# Patient Record
Sex: Female | Born: 1965
Health system: Southern US, Community
[De-identification: ages and names within clinical notes are randomized; demographics above are authoritative.]

## PROBLEM LIST (undated history)

## (undated) DIAGNOSIS — Z9889 Other specified postprocedural states: Secondary | ICD-10-CM

## (undated) DIAGNOSIS — R112 Nausea with vomiting, unspecified: Secondary | ICD-10-CM

---

## 1997-08-04 ENCOUNTER — Other Ambulatory Visit: Admission: RE | Admit: 1997-08-04 | Discharge: 1997-08-04 | Payer: Self-pay | Admitting: Obstetrics and Gynecology

## 1998-10-14 ENCOUNTER — Other Ambulatory Visit: Admission: RE | Admit: 1998-10-14 | Discharge: 1998-10-14 | Payer: Self-pay | Admitting: Obstetrics and Gynecology

## 1999-07-19 ENCOUNTER — Inpatient Hospital Stay (HOSPITAL_COMMUNITY): Admission: AD | Admit: 1999-07-19 | Discharge: 1999-07-22 | Payer: Self-pay | Admitting: Obstetrics and Gynecology

## 1999-07-23 ENCOUNTER — Encounter: Admission: RE | Admit: 1999-07-23 | Discharge: 1999-09-02 | Payer: Self-pay | Admitting: Obstetrics and Gynecology

## 1999-08-29 ENCOUNTER — Other Ambulatory Visit: Admission: RE | Admit: 1999-08-29 | Discharge: 1999-08-29 | Payer: Self-pay | Admitting: Obstetrics and Gynecology

## 2000-10-30 ENCOUNTER — Other Ambulatory Visit: Admission: RE | Admit: 2000-10-30 | Discharge: 2000-10-30 | Payer: Self-pay | Admitting: Obstetrics and Gynecology

## 2001-11-25 ENCOUNTER — Other Ambulatory Visit: Admission: RE | Admit: 2001-11-25 | Discharge: 2001-11-25 | Payer: Self-pay | Admitting: Obstetrics and Gynecology

## 2001-12-24 ENCOUNTER — Encounter: Admission: RE | Admit: 2001-12-24 | Discharge: 2001-12-24 | Payer: Self-pay | Admitting: Obstetrics and Gynecology

## 2001-12-24 ENCOUNTER — Encounter: Payer: Self-pay | Admitting: Obstetrics and Gynecology

## 2002-03-20 ENCOUNTER — Other Ambulatory Visit: Admission: RE | Admit: 2002-03-20 | Discharge: 2002-03-20 | Payer: Self-pay | Admitting: Obstetrics and Gynecology

## 2002-12-02 ENCOUNTER — Other Ambulatory Visit: Admission: RE | Admit: 2002-12-02 | Discharge: 2002-12-02 | Payer: Self-pay | Admitting: Obstetrics and Gynecology

## 2003-09-03 ENCOUNTER — Other Ambulatory Visit: Admission: RE | Admit: 2003-09-03 | Discharge: 2003-09-03 | Payer: Self-pay | Admitting: Obstetrics and Gynecology

## 2004-01-31 HISTORY — PX: ENDOMETRIAL ABLATION: SHX621

## 2004-03-17 ENCOUNTER — Encounter (INDEPENDENT_AMBULATORY_CARE_PROVIDER_SITE_OTHER): Payer: Self-pay | Admitting: Specialist

## 2004-03-17 ENCOUNTER — Inpatient Hospital Stay (HOSPITAL_COMMUNITY): Admission: RE | Admit: 2004-03-17 | Discharge: 2004-03-19 | Payer: Self-pay | Admitting: Obstetrics and Gynecology

## 2004-03-20 ENCOUNTER — Encounter: Admission: RE | Admit: 2004-03-20 | Discharge: 2004-04-14 | Payer: Self-pay | Admitting: Obstetrics and Gynecology

## 2004-05-10 ENCOUNTER — Other Ambulatory Visit: Admission: RE | Admit: 2004-05-10 | Discharge: 2004-05-10 | Payer: Self-pay | Admitting: Obstetrics and Gynecology

## 2005-04-12 ENCOUNTER — Ambulatory Visit (HOSPITAL_COMMUNITY): Admission: RE | Admit: 2005-04-12 | Discharge: 2005-04-12 | Payer: Self-pay | Admitting: Obstetrics and Gynecology

## 2005-04-12 ENCOUNTER — Encounter (INDEPENDENT_AMBULATORY_CARE_PROVIDER_SITE_OTHER): Payer: Self-pay | Admitting: *Deleted

## 2005-10-05 ENCOUNTER — Ambulatory Visit (HOSPITAL_COMMUNITY): Admission: RE | Admit: 2005-10-05 | Discharge: 2005-10-05 | Payer: Self-pay | Admitting: Specialist

## 2005-10-05 ENCOUNTER — Encounter: Payer: Self-pay | Admitting: Vascular Surgery

## 2008-06-24 ENCOUNTER — Encounter: Admission: RE | Admit: 2008-06-24 | Discharge: 2008-06-24 | Payer: Self-pay | Admitting: Family Medicine

## 2008-08-04 ENCOUNTER — Other Ambulatory Visit: Admission: RE | Admit: 2008-08-04 | Discharge: 2008-08-04 | Payer: Self-pay | Admitting: Obstetrics and Gynecology

## 2009-02-19 ENCOUNTER — Encounter: Admission: RE | Admit: 2009-02-19 | Discharge: 2009-02-19 | Payer: Self-pay | Admitting: Obstetrics and Gynecology

## 2009-03-15 ENCOUNTER — Other Ambulatory Visit: Admission: RE | Admit: 2009-03-15 | Discharge: 2009-03-15 | Payer: Self-pay | Admitting: Obstetrics and Gynecology

## 2009-09-29 ENCOUNTER — Other Ambulatory Visit: Admission: RE | Admit: 2009-09-29 | Discharge: 2009-09-29 | Payer: Self-pay | Admitting: Obstetrics and Gynecology

## 2010-06-17 NOTE — Discharge Summary (Signed)
[facility]  Patient:    Anna Wade, Anna Wade                      MRN: [id number] Adm. Date:  [id number] Disc. Date: [id number] Attending:  Trevor Iha Dictator:   Danie Chandler, R.N.                           Discharge Summary  ADMISSION DIAGNOSES:          1. Intrauterine pregnancy at 36-4/7 weeks.                               2. Breech presentation.                               3. Rupture of membranes in early labor.  DISCHARGE DIAGNOSES:          1. Intrauterine pregnancy at 36-4/7 weeks.                               2. Breech presentation.                               3. Rupture of membranes in early labor.  PROCEDURE:                    On July 19, 1999, primary low transverse cesarean  section.  REASON FOR ADMISSION:         The patient is a 45 year old married white female, gravida 2, para 0, with an estimated date of confinement of August 12, 1999, placing her at 36-4/[redacted] weeks gestation.  The patient complained of spontaneous rupture of membranes for gross clear fluid at approximately 5:30 a.m. on July 19, 1999. Evaluation in triage revealed gross rupture of membranes and the cervix to be closed and 50%.  An ultrasound was consistent with breech presentation.  Fetal heart tones were nonreactive and the patient was having mild contractions. Recommendation for cesarean section was made.  HOSPITAL COURSE:              The patient was taken to the operating room and underwent the above named procedure without complications.  This was productive of a viable female infant with Apgars of 8 at one minute and 9 at five minutes. Postoperatively, the patient did well.  On postoperative day #1, the patients hemoglobin was 11.1, hematocrit 32.3, and white blood cell count 9.9.  The patient had a good return of bowel function on this day and she was tolerating a regular diet.  She also had good pain control.  On postoperative day #2, the  patient was ambulating well without difficulty and she was discharged home on postoperative day #3.  CONDITION ON DISCHARGE:       Good.  DIET:                         Regular as tolerated.  ACTIVITY:                     No heavy lifting, no driving, and no vaginal entry.  FOLLOW-UP:  She is to follow up in the office in one to two weeks for incision check and she is to call for a temperature greater than 100 degrees, persistent nausea and vomiting, heavy vaginal bleeding, and/or redness or drainage from the incision site.  DISCHARGE MEDICATIONS:        1. Prenatal vitamins one p.o. q.d.                               2. Tylox one to two p.o. q.4h. p.r.n. pain.                               3. Motrin 600 mg one p.o. q.6h. p.r.n. pain. DD:  08/11/99 TD:  08/11/99 Job: [id number]

## 2010-06-17 NOTE — Op Note (Signed)
Anna, Wade NO.:  [id number]   MEDICAL RECORD NO.:  [id number]          PATIENT TYPE:  INP   LOCATION:  9103                          [facility]ILITY[facility]   PHYSICIAN:  Dineen Kid. Rana Snare, M.D.    DATE OF BIRTH:  1965-07-02   DATE OF PROCEDURE:  03/17/2004  DATE OF DISCHARGE:                                 OPERATIVE REPORT   PREOPERATIVE DIAGNOSES:  1.  Intrauterine pregnancy at 39 weeks.  2.  Previous cesarean section with desired repeat.  3.  Multiparity but desires sterility.   POSTOPERATIVE DIAGNOSES:  1.  Intrauterine pregnancy at 39 weeks.  2.  Previous cesarean section with desired repeat.  3.  Multiparity but desires sterility.   PROCEDURES:  1.  Repeat low segment transverse cesarean section.  2.  [place]land bilateral tubal ligation.   SURGEON:  Dineen Kid. Rana Snare, M.D.   ANESTHESIA:  Spinal.   INDICATIONS:  Anna Wade is a 45 year old G3, P1, A1, at 4 weeks'  gestation, who presents for repeat cesarean section.  The first pregnancy  was complicated by breech presentation requiring a cesarean section.  She  also desires sterilization and plans tubal ligation.  Risks and benefits  were discussed, informed consent was obtained.   Findings at the time of surgery were a viable female infant, Apgars were 8 and  9, pH arterial 7.31, weight is 8 pounds 8 ounces.   DESCRIPTION OF PROCEDURE:  After adequate analgesia, the patient was placed  in the supine position with a left lateral tilt.  She was sterilely prepped  and draped, the bladder was sterilely drained with a Foley catheter.  A  Pfannenstiel skin incision was made two fingerbreadths above the pubic  symphysis, taken sharply the previous Pfannenstiel skin incision from the  skin.  This was taken down sharply to the fascia, which was incised  transversely and extended superiorly and inferiorly off the bellies of the  rectus muscle, which were separated sharply in the midline.  The peritoneum  was  entered sharply.  The bladder flap was noted to be very low, and there  was a thin window at the lower segment.  The myotomy incision was then made  through that lower segment with clear fluid noted.  This was extended  laterally with the operator's fingertips.  A vacuum extractor was used to  direct the head through the myotomy incision, and the nares and pharynx were  suctioned, the placenta delivered, the cord clamped and cut, handed to  pediatricians for resuscitation.  The cord blood was obtained, placenta  extracted manually.  The uterus was exteriorized, wiped clean with a dry  lap.  The myotomy incision was closed in two layers, the first being a  running locking layer, the second being an imbricating layer of 0 Monocryl  suture.  The right fallopian tube was identified by the fimbriated end, a  portion of the tube grasped.  Two sutures of 0 plain stitch were passed in  the mesosalpinx, doubly ligated, the central portion excised.  Tubal ostia  were made hemostatic with Bovie cautery.  The proximal portion of the tube  was ligated with 2-0 silk.  The left fallopian tube was identified by the  fimbriated end, the midportion of the tube grasped with a Babcock clamp, two  sutures of 0 plain passed through the mesosalpinx, doubly ligated, the  central portion excised.  The tubal ostia were made hemostatic with Bovie  cautery.  The proximal portion of the tube ligated with 2-0 silk.  The  uterus was placed back in the peritoneal cavity and after a copious amount  of irrigation, the fallopian tubes were checked as well as the myotomy  incision and noted to be hemostatic.  The peritoneum was then closed with 0  Monocryl suture, the rectus muscle plicated in the midline, irrigation  applied, and after adequate hemostasis the fascia was closed with #1 PDS in  running fashion.  Irrigation of skin applied and after adequate hemostasis,  the skin was stapled and Steri-Strips applied.  The  patient tolerated the  procedure well and was stable on transfer to the recovery room.  The sponge,  needle and instrument count was normal x3.  Estimated blood loss was 600 mL.  The patient received 1 g of Rocephin after delivery of the placenta.      DCL/MEDQ  D:  03/17/2004  T:  03/17/2004  Job:  [id number]

## 2010-06-17 NOTE — Op Note (Signed)
[facility]  Patient:    Anna Wade, Anna Wade                      MRN: [id number] Proc. Date: 07/19/99 Adm. Date:  [id number] Attending:  Trevor Iha                           Operative Report  PREOPERATIVE DIAGNOSIS:       Intrauterine pregnancy at 36-4/7 weeks.  Breech presentation.  Rupture of membranes in early labor.  POSTOPERATIVE DIAGNOSIS:      Intrauterine pregnancy at 36-4/7 weeks.  Breech presentation.  Rupture of membranes in early labor.  OPERATION:                    Low transverse cesarean section.  SURGEON:                      Guy Sandifer. Arleta Creek, M.D.  ASSISTANT:  ANESTHESIA:                   Spinal by J. Leilani Able, Montez Hageman., M.D.  ESTIMATED BLOOD LOSS:         600 cc.  FINDINGS:                     Viable female infant with Apgars of 8 and 9 at one nd five minutes, respectively.  Birth weight and arterial cord pH pending.  INDICATIONS:                  The patient is a 45 year old married white female, gravida 2, para 0, abortus 1, EDC of August 12, 1999, consistent with early ultrasound placing her at 36-4/7 weeks.  She complains of spontaneous rupture of membranes for gross clear fluid at 5:30 a.m. today.  Evaluation in triage reveals gross rupture of membranes, the cervix to be closed and 50%, and ultrasound consistent with breech.  Fetal heart tones are reactive and the patient is having mild contractions.  Recommendation for cesarean section is made.  Possible risks and complications are discussed with the patient and her husband including, but not limited to, infection, bowel, bladder, or ureteral damage, bleeding requiring transfusion of blood products with possible transfusion reactive, HIV, and hepatitis acquisition, DVT, PE, and pneumonia.  All questions are answered and consent is signed and on the chart.  DESCRIPTION OF PROCEDURE:     The patient is taken to the operating room where  spinal  anesthetic is placed.  She is then placed in the dorsal supine position ith a 15 degree left lateral wedge.  She is then prepped, Foley catheter is placed n the bladder as a drain, and she is draped in a sterile fashion.  After testing or adequate spinal anesthesia, the skin is entered through a Pfannenstiel incision and dissection is carried out in layers to the peritoneum.  The peritoneum is sharply entered and extended superiorly and inferiorly.  The vesicouterine peritoneum is taken down cephalolaterally, the bladder flap is developed and the bladder blade is placed.  The uterus is incised in a low transverse manner.  The uterine cavity s entered bluntly with a Kelly clamp.  Incision is extended cephalolaterally with the fingers.  The infant is then delivered from the breech presentation without difficulty.  Oral and nasopharynx are suctioned.  Good cry and tone is noted. he cord is clamped and cut  and the infant is handed to the awaiting pediatrics team. The placenta is manually delivered.  Internal uterine contour is normal.  The uterus is closed in a running locking fashion with 0 Monocryl suture and good hemostasis is obtained.  The tubes and ovaries are normal.  Anterior peritoneum is closed in a running fashion with 0 Monocryl which is also used to reapproximate the pyramidalis muscle in the midline.  The anterior rectus fascia is closed in a running fashion with 0 PDS and the skin is closed with clips.  All sponge, needle, and instrument counts were correct.  The patient is transferred to the recovery  room in stable condition. DD:  07/19/99 TD:  07/20/99 Job: [id number]

## 2010-06-17 NOTE — Discharge Summary (Signed)
NAMESHARMEL, BALLANTINE NO.:  [id number]   MEDICAL RECORD NO.:  [id number]          PATIENT TYPE:  INP   LOCATION:  9103                          [facility]   PHYSICIAN:  Dineen Kid. Rana Snare, M.D.    DATE OF BIRTH:  10/27/1965   DATE OF ADMISSION:  03/17/2004  DATE OF DISCHARGE:  03/19/2004                                 DISCHARGE SUMMARY   ADMISSION DIAGNOSES:  1.  Intrauterine pregnancy at 56 weeks' estimated gestational age.  2.  Previous cesarean section, desires repeat.  3.  Multiparity, desires sterility.   DISCHARGE DIAGNOSES:  1.  Status post low transverse cesarean section.  2.  Viable female infant.  3.  Permanent sterilization.   PROCEDURES:  1.  Repeat low transverse cesarean section.  2.  [place] bilateral tubal ligation.   REASON FOR ADMISSION:  Please see written H&P.   HOSPITAL COURSE:  The patient is a 45 year old. G3, P1 that was admitted to  [facility] at 39 weeks' estimated gestational age for a  scheduled cesarean delivery.  Her first pregnancy had been complicated by a  breech presentation requiring cesarean delivery.  Due to multiparity, the  patient also had requested permanent sterilization.  On the morning of  admission, the patient was taken to the operating room where spinal  anesthesia was administered without difficulty.  A low transverse incision  was made with delivery of a viable female infant weighing 8 pounds 8 ounces  with Apgar's 8 and 9 at 1 and 5 minutes.  Arterial cord pH of 7.31.  The  patient also had bilateral tubal ligation without difficulty.  She tolerated  the procedure well and was taken to the recovery room in stable condition.  On postop day #1, the patient was without complaint.  Vital signs were  stable.  She was afebrile.  Abdomen was soft.  Fundus was firm and  nontender.  Abdominal dressing was noted to have a small amount of drainage  noted on bandage.  Foley had been discontinued.  The  patient was voiding  adequately.  She was ambulating well.  Laboratory data revealed hemoglobin  of 11.7, platelet count 152,000, WBC of 8.9.  IV was discontinued.  Abdominal dressing was removed.  On the following morning, the patient was  without complaint.  She desired early discharge.  Vital signs were stable.  She was afebrile.  Fundus was firm and nontender.  Incision was clean, dry  and intact.  Staples were removed and the patient was discharged home.   CONDITION ON DISCHARGE:  Good.   DIET:  Regular as tolerated.   ACTIVITY:  No heavy lifting, no driving x2 weeks, no vaginal entry.   FOLLOW UP:  The patient is to follow up in the office in 1-2 weeks for  incision check.  She is to call for temperature greater than 100 degrees,  persistent nausea and vomiting, heavy vaginal bleeding and/or redness or  drainage from incisional site.   DISCHARGE MEDICATIONS:  1.  Tylox, #30, one p.o. every 4-6 hours p.r.n.  2.  Motrin 600 mg  every 6 hours.  3.  Prenatal vitamins one p.o. daily.  4.  Colace one p.o. daily p.r.n.      CC/MEDQ  D:  04/04/2004  T:  04/04/2004  Job:  [id number]

## 2010-06-17 NOTE — Op Note (Signed)
Anna Wade, Anna Wade NO.:  [id number]   MEDICAL RECORD NO.:  [id number]          PATIENT TYPE:  AMB   LOCATION:  SDC                           [facility]   PHYSICIAN:  Dineen Kid. Rana Snare, M.D.    DATE OF BIRTH:  12-08-1965   DATE OF PROCEDURE:  04/12/2005  DATE OF DISCHARGE:                                 OPERATIVE REPORT   PREOPERATIVE DIAGNOSES:  1.  Menometrorrhagia.  2.  History of menorrhagia.  3.  Endometrial mass on salin infusion ultrasound.   POSTOPERATIVE DIAGNOSES:  1.  Menometrorrhagia.  2.  History of menorrhagia.  3.  Endometrial mass seen on saline infusion ultrasound.   OPERATION/PROCEDURE:  1.  Hysteroscopy with dilatation and curettage.  2.  Polypectomy.  3.  NovaSure endometrial ablation.   SURGEON:  Dineen Kid. Rana Snare, M.D.   ANESTHESIA:  Monitored anesthesia care and local paracervical block.   INDICATIONS:  Mrs. Anna Wade is a 45 year old who has had a tubal ligation in  the past.  She has continued on birth control pills due to menorrhagia with  only some bleeding.  She began having menometrorrhagia.  Underwent a saline  infusion ultrasound showing endometrial polyp. She presents for hysteroscopy  and seen for evaluation of removal of the polyp.  Because of persistent  menorrhagia and no future fertility, having had a tubal ligation, she  desires NovaSure endometrial ablation.  The risks and benefits of both  procedures were discussed at length which include but not limited to risk of  infection, bleeding, damage to uterus, tubes, ovaries, bowel, bladder,  possibility of recurrence if endometrial polyp, the possibility that this  may not alleviate the bleeding,  risks associated with anesthesia, risks  associated with blood loss requiring transfusion.  She gives her informed  consent and wished to proceed.   OPERATIVE FINDINGS:  Normal endometrial cavity.  Normal-appearing ostia.  She does have a posterior endometrial wall thickening  consistent with a  small endometrial polyp.   DESCRIPTION OF PROCEDURE:  After adequate anesthesia, the patient was placed  in the dorsal lithotomy position. She was sterilely prepped and draped.  The  bladder was sterilely drained.  Speculum was placed.  Tenaculum was placed  on the anterior lip of the cervix.  Paracervical block was placed with 1%  Xylocaine with 1:100,000 epinephrine, a total of 20 mL used.  Uterus was  sounded to 8.5 cm, easily dilated to a #27 Pratt dilator.  The cervix was  sounded to 3.5 cm.  Hysteroscope was inserted.  The above findings were  noted.  Curettage was performed retrieving small fragments of endometrial  polyp.  This was followed by NovaSure endometrial ablation  the cavity width  at 8.7, total cavity length of 5.  After test with the NovaSure device for  proper seal, the procedure was carried out with a total power of 102 for  total time of 75 seconds.  Procedure was apparently uncomplicated followed  by hysteroscopy revealing a good  thermal burn throughout the entirety of  the cavity.  No obvious perforations were noted.  The  hysteroscope was  removed.  The tenaculum removed from the anterior lip of the cervix, noted  to be hemostatic.  The patient was then transferred to the recovery room in  stable condition.  Sorbitol deficit was 60 mL.  The patient received 30 mg  Toradol postoperatively.   The patient received 1 g of Rocephin.   DISPOSITION:  The patient was discharged home for followup in the office in  two to three weeks.  Sent home with routine instruction sheet for D&C and  told to return for increased pain, fever or bleeding.      Dineen Kid Rana Snare, M.D.  Electronically Signed     DCL/MEDQ  D:  04/12/2005  T:  04/13/2005  Job:  [id number]

## 2010-06-17 NOTE — H&P (Signed)
NAMEHECTOR, VENNE NO.:  [id number]   MEDICAL RECORD NO.:  [id number]          PATIENT TYPE:  INP   LOCATION:  NA                            [facility]   PHYSICIAN:  Dineen Kid. Rana Snare, M.D.    DATE OF BIRTH:  August 23, 1965   DATE OF ADMISSION:  03/17/2004  DATE OF DISCHARGE:                                HISTORY & PHYSICAL   HISTORY OF PRESENT ILLNESS:  Ms. Wilmouth is a 45 year old G3, P1, A1, at 62  weeks' gestational age who repeats for repeat cesarean section.  Her first  pregnancy was complicated by breech presentation requiring cesarean section  and she desires repeat C-section .  This pregnancy has been uncomplicated.  She did have amniocentesis for advanced maternal age which returned as  normal.  She also has multiparity and desires sterility.  Her EDC is  March 21, 2004, and she is Group B strep positive.   PAST MEDICAL HISTORY:  Negative.   PAST SURGICAL HISTORY:  C-section in June 2001.   MEDICATIONS:  Prenatal vitamins.   She has no known drug allergies.   PHYSICAL EXAMINATION:  VITAL SIGNS:  Her blood pressure is 118/62, weight  224.  HEART:  Regular rate and rhythm.  LUNGS:  Clear to auscultation bilaterally.  ABDOMEN:  Gravid, nontender.  PELVIC:  Cervix is closed, thick, and high.   IMPRESSION/PLAN:  1.  Intrauterine pregnancy at 39 weeks with previous cesarean section,      desires repeat.  Plan:  Repeat low transverse cesarean section.  2.  Multiparity with desired sterility.  Plan:  Parkland bilateral tubal      ligation.  The risks and benefits were discussed at length which include      5 out of 1000 failure.  She gives informed consent and wishes to      proceed.      DCL/MEDQ  D:  03/16/2004  T:  03/16/2004  Job:  [id number]

## 2010-09-29 ENCOUNTER — Other Ambulatory Visit: Payer: Self-pay | Admitting: Family Medicine

## 2010-09-29 ENCOUNTER — Other Ambulatory Visit: Payer: Self-pay | Admitting: Nurse Practitioner

## 2010-09-29 ENCOUNTER — Other Ambulatory Visit (HOSPITAL_COMMUNITY)
Admission: RE | Admit: 2010-09-29 | Discharge: 2010-09-29 | Disposition: A | Discharge status: Home / Self Care | Payer: 59 | Source: Ambulatory Visit | Attending: Obstetrics and Gynecology | Admitting: Obstetrics and Gynecology

## 2010-09-29 DIAGNOSIS — Z01419 Encounter for gynecological examination (general) (routine) without abnormal findings: Secondary | ICD-10-CM | POA: Insufficient documentation

## 2010-09-29 DIAGNOSIS — Z1231 Encounter for screening mammogram for malignant neoplasm of breast: Secondary | ICD-10-CM

## 2010-10-06 ENCOUNTER — Ambulatory Visit
Admission: RE | Admit: 2010-10-06 | Discharge: 2010-10-06 | Disposition: A | Discharge status: Home / Self Care | Payer: 59 | Source: Ambulatory Visit | Attending: Family Medicine | Admitting: Family Medicine

## 2010-10-06 DIAGNOSIS — Z1231 Encounter for screening mammogram for malignant neoplasm of breast: Secondary | ICD-10-CM

## 2011-05-16 ENCOUNTER — Other Ambulatory Visit: Payer: Self-pay | Admitting: Family Medicine

## 2011-05-16 DIAGNOSIS — R221 Localized swelling, mass and lump, neck: Secondary | ICD-10-CM

## 2011-05-17 ENCOUNTER — Ambulatory Visit
Admission: RE | Admit: 2011-05-17 | Discharge: 2011-05-17 | Disposition: A | Discharge status: Home / Self Care | Payer: 59 | Source: Ambulatory Visit | Attending: Family Medicine | Admitting: Family Medicine

## 2011-05-17 DIAGNOSIS — R221 Localized swelling, mass and lump, neck: Secondary | ICD-10-CM

## 2013-09-25 ENCOUNTER — Other Ambulatory Visit: Payer: Self-pay | Admitting: Family Medicine

## 2013-09-25 DIAGNOSIS — R591 Generalized enlarged lymph nodes: Secondary | ICD-10-CM

## 2013-10-30 ENCOUNTER — Encounter (INDEPENDENT_AMBULATORY_CARE_PROVIDER_SITE_OTHER): Payer: Self-pay

## 2013-10-30 ENCOUNTER — Ambulatory Visit
Admission: RE | Admit: 2013-10-30 | Discharge: 2013-10-30 | Disposition: A | Discharge status: Home / Self Care | Payer: 59 | Source: Ambulatory Visit | Attending: Family Medicine | Admitting: Family Medicine

## 2013-10-30 DIAGNOSIS — R591 Generalized enlarged lymph nodes: Secondary | ICD-10-CM

## 2014-03-04 ENCOUNTER — Other Ambulatory Visit: Payer: Self-pay | Admitting: Obstetrics and Gynecology

## 2014-03-06 LAB — CYTOLOGY - PAP

## 2014-06-08 ENCOUNTER — Other Ambulatory Visit: Payer: Self-pay | Admitting: Family Medicine

## 2014-06-08 DIAGNOSIS — R51 Headache: Principal | ICD-10-CM

## 2014-06-08 DIAGNOSIS — R519 Headache, unspecified: Secondary | ICD-10-CM

## 2014-06-10 ENCOUNTER — Ambulatory Visit
Admission: RE | Admit: 2014-06-10 | Discharge: 2014-06-10 | Disposition: A | Discharge status: Home / Self Care | Payer: 59 | Source: Ambulatory Visit | Attending: Family Medicine | Admitting: Family Medicine

## 2014-06-10 DIAGNOSIS — R519 Headache, unspecified: Secondary | ICD-10-CM

## 2014-06-10 DIAGNOSIS — R51 Headache: Principal | ICD-10-CM

## 2014-06-10 MED ORDER — GADOBENATE DIMEGLUMINE 529 MG/ML IV SOLN: 16.0000 mL | Freq: Once | INTRAVENOUS | Status: AC | PRN

## 2014-11-03 ENCOUNTER — Encounter: Payer: Self-pay | Admitting: Allergy and Immunology

## 2014-11-03 ENCOUNTER — Ambulatory Visit (INDEPENDENT_AMBULATORY_CARE_PROVIDER_SITE_OTHER): Payer: 59 | Admitting: Allergy and Immunology

## 2014-11-03 VITALS — BP 98/62 | HR 62 | Temp 97.8°F | Resp 14 | Ht 64.96 in | Wt 179.7 lb

## 2014-11-03 DIAGNOSIS — L719 Rosacea, unspecified: Secondary | ICD-10-CM

## 2014-11-03 DIAGNOSIS — H1045 Other chronic allergic conjunctivitis: Secondary | ICD-10-CM | POA: Diagnosis not present

## 2014-11-03 DIAGNOSIS — H10829 Rosacea conjunctivitis, unspecified eye: Secondary | ICD-10-CM

## 2014-11-03 DIAGNOSIS — H1010 Acute atopic conjunctivitis, unspecified eye: Secondary | ICD-10-CM

## 2014-11-03 MED ORDER — METHYLPREDNISOLONE ACETATE 80 MG/ML IJ SUSP
80.0000 mg | Freq: Once | INTRAMUSCULAR | Status: AC
  Administered 2014-11-03: 80 mg via INTRAMUSCULAR

## 2014-11-03 MED ORDER — MONTELUKAST SODIUM 10 MG PO TABS: 10.0000 mg | ORAL_TABLET | Freq: Every day | ORAL | Status: DC

## 2014-11-03 MED ORDER — DOXYCYCLINE HYCLATE 50 MG PO CAPS: 100.0000 mg | ORAL_CAPSULE | Freq: Every day | ORAL | Status: DC

## 2014-11-03 NOTE — Progress Notes (Signed)
Anna Wade    NEW PATIENT NOTE  Subjective:   Patient ID: Anna Wade is a 49 y.o. female with a chief complaint of Allergies  and the following problems:  HPI Comments: . Anna Wade has a one year history of eye problems presenting as burning / itching, watery eyes, a tired feeling to her eyes, without any associated nasal symptoms or obvious triggers. She has seen three eye doctors who have informed her that she does not have dry eye or other significant problems and they have treated her with various eye drops including pataday / pazeo/ lotemax, and antihistamines, all of which really did not help her. She has no other associated atopic diseases or symptoms. She has not noted any facial blushing or flushing or skin itchiness.   History reviewed. No pertinent past medical history.  Past Surgical History  Procedure Laterality Date  . Cesarean section      x2     Current outpatient prescriptions:  .  amphetamine-dextroamphetamine (ADDERALL) 20 MG tablet, Take 20 mg by mouth 2 (two) times daily., Disp: , Rfl:  .  Multiple Vitamin (MULTIVITAMIN) tablet, Take 1 tablet by mouth daily., Disp: , Rfl:  .  Omega-3 Fatty Acids (FISH OIL) 1000 MG CAPS, Take by mouth 2 (two) times daily., Disp: , Rfl:  .  LOTEMAX 0.5 % GEL, Place 1 drop into both eyes as needed., Disp: , Rfl: 0  No orders of the defined types were placed in this encounter.    No Known Allergies  Review of Systems  Constitutional: Negative.   HENT: Negative for congestion, hearing loss, nosebleeds, sore throat and tinnitus.   Eyes: Positive for discharge and redness. Negative for blurred vision, double vision, photophobia and pain.  Respiratory: Negative for cough, sputum production, shortness of breath and wheezing.   Cardiovascular: Negative for chest pain and leg swelling.  Gastrointestinal: Negative for heartburn and nausea.  Skin: Negative for itching and  rash.  Neurological: Negative for dizziness and headaches.    History reviewed. No pertinent family history.  Social History   Social History  . Marital Status: Single    Spouse Name: N/A  . Number of Children: N/A  . Years of Education: N/A   Occupational History  . Not on file.   Social History Main Topics  . Smoking status: Former Smoker    Quit date: 04/29/2004  . Smokeless tobacco: Not on file  . Alcohol Use: Not on file     Comment: 1x a month beer  . Drug Use: No  . Sexual Activity: Not on file   Other Topics Concern  . Not on file   Social History Narrative  . No narrative on file    Environmental History: Pets in the home: dogs (1) and cats (1). Flooring: wall-to-wall carpeting Climate Control: central or room air conditioning Dust Mite Controls: Dust mite controls are not in place. Tobacco Smoke in Home: no    Objective:   Filed Vitals:   11/03/14 0909  BP: 98/62  Pulse: 62  Temp: 97.8 F (36.6 C)  Resp: 14    Physical Exam  Constitutional: She is oriented to person, place, and time and well-developed, well-nourished, and in no distress.  HENT:  Right Ear: Tympanic membrane and external ear normal.  Left Ear: Tympanic membrane and external ear normal.  Nose: Nose normal. No mucosal edema, rhinorrhea, nose lacerations, sinus tenderness, nasal deformity or septal deviation.  Mouth/Throat:  Oropharynx is clear and moist.  Eyes: Pupils are equal, round, and reactive to light. Right eye exhibits no chemosis, no discharge and no exudate. No foreign body present in the right eye. Left eye exhibits no chemosis, no discharge and no exudate. No foreign body present in the left eye. Right conjunctiva is injected. Left conjunctiva is injected. Left conjunctiva has no hemorrhage. No scleral icterus.  Neck: No JVD present. No tracheal deviation present. No thyromegaly present.  Cardiovascular: Normal rate, regular rhythm and normal heart sounds.  Exam reveals no  gallop.   No murmur heard. Pulmonary/Chest: No stridor. No respiratory distress. She has no wheezes. She has no rales. She exhibits no tenderness.  Abdominal: She exhibits no distension and no mass. There is no tenderness. There is no rebound and no guarding.  Musculoskeletal: She exhibits no edema.  Lymphadenopathy:    She has no cervical adenopathy.  Neurological: She is alert and oriented to person, place, and time.  Skin: No rash noted. No erythema. No pallor.    Diagnostics: allergy skin tests were performed. She demonstrated positives to Mold..     Assessment and Plan:   1. Seasonal allergic conjunctivitis   2. Rosacea blepharoconjunctivitis         1. Allergen avoidance measures  2. Treat inflammation:   A. Depomerdol  IM now  B. Montelukast  one tablet one time per day  3.Treat rosacea:   A. Doxycyline  - one tablet one time per day.  4. Can continue OTC antihistamine and prescription eye drops  5. Return in 4 weeks  Hopefully with a combination of allergen avoidance, anti-inflammatory agents, and therapy for rosacea we can finally get Zelpha' eye inflammation under better control. She has been stuck in this inflammatory state for almost a year. i will regroup with her in 4 weeks to assess her response.   Anna Schimke, MD [facility]

## 2014-11-03 NOTE — Patient Instructions (Signed)
  1. Allergen avoidance measures  2. Treat inflammation:   A. Depomerdol  IM now  B. Montelukast  one tablet one time per day  3.Treat rosacea:   A. Doxycyline  - one tablet one time per day.  4. Can continue OTC antihistamine and prescription eye drops  5. Return in 4 weeks

## 2016-02-28 DIAGNOSIS — H04123 Dry eye syndrome of bilateral lacrimal glands: Secondary | ICD-10-CM | POA: Diagnosis not present

## 2016-04-26 DIAGNOSIS — Z0000 Encounter for general adult medical examination without abnormal findings: Secondary | ICD-10-CM | POA: Diagnosis not present

## 2016-04-26 DIAGNOSIS — Z13220 Encounter for screening for lipoid disorders: Secondary | ICD-10-CM | POA: Diagnosis not present

## 2016-04-26 DIAGNOSIS — E538 Deficiency of other specified B group vitamins: Secondary | ICD-10-CM | POA: Diagnosis not present

## 2016-05-22 DIAGNOSIS — Z1211 Encounter for screening for malignant neoplasm of colon: Secondary | ICD-10-CM | POA: Diagnosis not present

## 2016-06-27 DIAGNOSIS — Z01419 Encounter for gynecological examination (general) (routine) without abnormal findings: Secondary | ICD-10-CM | POA: Diagnosis not present

## 2016-06-30 DIAGNOSIS — K5730 Diverticulosis of large intestine without perforation or abscess without bleeding: Secondary | ICD-10-CM | POA: Diagnosis not present

## 2016-06-30 DIAGNOSIS — Z1211 Encounter for screening for malignant neoplasm of colon: Secondary | ICD-10-CM | POA: Diagnosis not present

## 2016-06-30 DIAGNOSIS — K6289 Other specified diseases of anus and rectum: Secondary | ICD-10-CM | POA: Diagnosis not present

## 2016-07-06 DIAGNOSIS — M5386 Other specified dorsopathies, lumbar region: Secondary | ICD-10-CM | POA: Diagnosis not present

## 2016-07-06 DIAGNOSIS — M9905 Segmental and somatic dysfunction of pelvic region: Secondary | ICD-10-CM | POA: Diagnosis not present

## 2016-07-06 DIAGNOSIS — M9903 Segmental and somatic dysfunction of lumbar region: Secondary | ICD-10-CM | POA: Diagnosis not present

## 2016-07-11 DIAGNOSIS — M9905 Segmental and somatic dysfunction of pelvic region: Secondary | ICD-10-CM | POA: Diagnosis not present

## 2016-07-11 DIAGNOSIS — M9903 Segmental and somatic dysfunction of lumbar region: Secondary | ICD-10-CM | POA: Diagnosis not present

## 2016-07-11 DIAGNOSIS — M5386 Other specified dorsopathies, lumbar region: Secondary | ICD-10-CM | POA: Diagnosis not present

## 2016-07-24 DIAGNOSIS — M5386 Other specified dorsopathies, lumbar region: Secondary | ICD-10-CM | POA: Diagnosis not present

## 2016-07-24 DIAGNOSIS — M9903 Segmental and somatic dysfunction of lumbar region: Secondary | ICD-10-CM | POA: Diagnosis not present

## 2016-07-24 DIAGNOSIS — M9905 Segmental and somatic dysfunction of pelvic region: Secondary | ICD-10-CM | POA: Diagnosis not present

## 2016-07-26 DIAGNOSIS — M47816 Spondylosis without myelopathy or radiculopathy, lumbar region: Secondary | ICD-10-CM | POA: Diagnosis not present

## 2016-07-26 DIAGNOSIS — M5126 Other intervertebral disc displacement, lumbar region: Secondary | ICD-10-CM | POA: Diagnosis not present

## 2016-07-26 DIAGNOSIS — M5127 Other intervertebral disc displacement, lumbosacral region: Secondary | ICD-10-CM | POA: Diagnosis not present

## 2016-07-27 DIAGNOSIS — M9905 Segmental and somatic dysfunction of pelvic region: Secondary | ICD-10-CM | POA: Diagnosis not present

## 2016-07-27 DIAGNOSIS — M5386 Other specified dorsopathies, lumbar region: Secondary | ICD-10-CM | POA: Diagnosis not present

## 2016-07-27 DIAGNOSIS — M9903 Segmental and somatic dysfunction of lumbar region: Secondary | ICD-10-CM | POA: Diagnosis not present

## 2016-08-04 DIAGNOSIS — E538 Deficiency of other specified B group vitamins: Secondary | ICD-10-CM | POA: Diagnosis not present

## 2016-08-14 DIAGNOSIS — J019 Acute sinusitis, unspecified: Secondary | ICD-10-CM | POA: Diagnosis not present

## 2016-08-31 DIAGNOSIS — M5386 Other specified dorsopathies, lumbar region: Secondary | ICD-10-CM | POA: Diagnosis not present

## 2016-08-31 DIAGNOSIS — M9903 Segmental and somatic dysfunction of lumbar region: Secondary | ICD-10-CM | POA: Diagnosis not present

## 2016-08-31 DIAGNOSIS — M9905 Segmental and somatic dysfunction of pelvic region: Secondary | ICD-10-CM | POA: Diagnosis not present

## 2016-09-11 DIAGNOSIS — M9903 Segmental and somatic dysfunction of lumbar region: Secondary | ICD-10-CM | POA: Diagnosis not present

## 2016-09-11 DIAGNOSIS — M5386 Other specified dorsopathies, lumbar region: Secondary | ICD-10-CM | POA: Diagnosis not present

## 2016-09-11 DIAGNOSIS — M5416 Radiculopathy, lumbar region: Secondary | ICD-10-CM | POA: Diagnosis not present

## 2016-09-13 DIAGNOSIS — M5416 Radiculopathy, lumbar region: Secondary | ICD-10-CM | POA: Diagnosis not present

## 2016-09-13 DIAGNOSIS — M5386 Other specified dorsopathies, lumbar region: Secondary | ICD-10-CM | POA: Diagnosis not present

## 2016-09-13 DIAGNOSIS — M9903 Segmental and somatic dysfunction of lumbar region: Secondary | ICD-10-CM | POA: Diagnosis not present

## 2016-09-14 DIAGNOSIS — M9903 Segmental and somatic dysfunction of lumbar region: Secondary | ICD-10-CM | POA: Diagnosis not present

## 2016-09-14 DIAGNOSIS — M5386 Other specified dorsopathies, lumbar region: Secondary | ICD-10-CM | POA: Diagnosis not present

## 2016-09-14 DIAGNOSIS — M5416 Radiculopathy, lumbar region: Secondary | ICD-10-CM | POA: Diagnosis not present

## 2016-10-18 DIAGNOSIS — M5386 Other specified dorsopathies, lumbar region: Secondary | ICD-10-CM | POA: Diagnosis not present

## 2016-10-18 DIAGNOSIS — M9903 Segmental and somatic dysfunction of lumbar region: Secondary | ICD-10-CM | POA: Diagnosis not present

## 2016-10-18 DIAGNOSIS — M9905 Segmental and somatic dysfunction of pelvic region: Secondary | ICD-10-CM | POA: Diagnosis not present

## 2016-12-06 DIAGNOSIS — M9903 Segmental and somatic dysfunction of lumbar region: Secondary | ICD-10-CM | POA: Diagnosis not present

## 2016-12-06 DIAGNOSIS — M9905 Segmental and somatic dysfunction of pelvic region: Secondary | ICD-10-CM | POA: Diagnosis not present

## 2016-12-06 DIAGNOSIS — M5386 Other specified dorsopathies, lumbar region: Secondary | ICD-10-CM | POA: Diagnosis not present

## 2017-01-31 DIAGNOSIS — M5386 Other specified dorsopathies, lumbar region: Secondary | ICD-10-CM | POA: Diagnosis not present

## 2017-01-31 DIAGNOSIS — M9903 Segmental and somatic dysfunction of lumbar region: Secondary | ICD-10-CM | POA: Diagnosis not present

## 2017-01-31 DIAGNOSIS — M9905 Segmental and somatic dysfunction of pelvic region: Secondary | ICD-10-CM | POA: Diagnosis not present

## 2017-03-20 DIAGNOSIS — M7711 Lateral epicondylitis, right elbow: Secondary | ICD-10-CM | POA: Diagnosis not present

## 2017-03-27 DIAGNOSIS — M25521 Pain in right elbow: Secondary | ICD-10-CM | POA: Diagnosis not present

## 2017-03-27 DIAGNOSIS — M7711 Lateral epicondylitis, right elbow: Secondary | ICD-10-CM | POA: Diagnosis not present

## 2017-03-30 DIAGNOSIS — M25521 Pain in right elbow: Secondary | ICD-10-CM | POA: Diagnosis not present

## 2017-03-30 DIAGNOSIS — M7711 Lateral epicondylitis, right elbow: Secondary | ICD-10-CM | POA: Diagnosis not present

## 2017-04-03 DIAGNOSIS — M25521 Pain in right elbow: Secondary | ICD-10-CM | POA: Diagnosis not present

## 2017-04-03 DIAGNOSIS — M7711 Lateral epicondylitis, right elbow: Secondary | ICD-10-CM | POA: Diagnosis not present

## 2017-04-10 DIAGNOSIS — M25521 Pain in right elbow: Secondary | ICD-10-CM | POA: Diagnosis not present

## 2017-04-10 DIAGNOSIS — M7711 Lateral epicondylitis, right elbow: Secondary | ICD-10-CM | POA: Diagnosis not present

## 2017-04-17 DIAGNOSIS — M25521 Pain in right elbow: Secondary | ICD-10-CM | POA: Diagnosis not present

## 2017-04-17 DIAGNOSIS — M7711 Lateral epicondylitis, right elbow: Secondary | ICD-10-CM | POA: Diagnosis not present

## 2017-04-19 DIAGNOSIS — M5386 Other specified dorsopathies, lumbar region: Secondary | ICD-10-CM | POA: Diagnosis not present

## 2017-04-19 DIAGNOSIS — M9903 Segmental and somatic dysfunction of lumbar region: Secondary | ICD-10-CM | POA: Diagnosis not present

## 2017-04-19 DIAGNOSIS — M9905 Segmental and somatic dysfunction of pelvic region: Secondary | ICD-10-CM | POA: Diagnosis not present

## 2017-05-01 DIAGNOSIS — H04123 Dry eye syndrome of bilateral lacrimal glands: Secondary | ICD-10-CM | POA: Diagnosis not present

## 2017-05-01 DIAGNOSIS — H25012 Cortical age-related cataract, left eye: Secondary | ICD-10-CM | POA: Diagnosis not present

## 2017-05-03 DIAGNOSIS — Z23 Encounter for immunization: Secondary | ICD-10-CM | POA: Diagnosis not present

## 2017-05-03 DIAGNOSIS — Z0000 Encounter for general adult medical examination without abnormal findings: Secondary | ICD-10-CM | POA: Diagnosis not present

## 2017-05-03 DIAGNOSIS — Z136 Encounter for screening for cardiovascular disorders: Secondary | ICD-10-CM | POA: Diagnosis not present

## 2017-05-25 DIAGNOSIS — H1132 Conjunctival hemorrhage, left eye: Secondary | ICD-10-CM | POA: Diagnosis not present

## 2017-08-01 DIAGNOSIS — Z01419 Encounter for gynecological examination (general) (routine) without abnormal findings: Secondary | ICD-10-CM | POA: Diagnosis not present

## 2017-08-06 DIAGNOSIS — M5386 Other specified dorsopathies, lumbar region: Secondary | ICD-10-CM | POA: Diagnosis not present

## 2017-08-06 DIAGNOSIS — M9903 Segmental and somatic dysfunction of lumbar region: Secondary | ICD-10-CM | POA: Diagnosis not present

## 2017-08-06 DIAGNOSIS — M9905 Segmental and somatic dysfunction of pelvic region: Secondary | ICD-10-CM | POA: Diagnosis not present

## 2017-08-07 DIAGNOSIS — M9903 Segmental and somatic dysfunction of lumbar region: Secondary | ICD-10-CM | POA: Diagnosis not present

## 2017-08-07 DIAGNOSIS — M9905 Segmental and somatic dysfunction of pelvic region: Secondary | ICD-10-CM | POA: Diagnosis not present

## 2017-08-07 DIAGNOSIS — M5386 Other specified dorsopathies, lumbar region: Secondary | ICD-10-CM | POA: Diagnosis not present

## 2017-08-08 DIAGNOSIS — M9903 Segmental and somatic dysfunction of lumbar region: Secondary | ICD-10-CM | POA: Diagnosis not present

## 2017-08-08 DIAGNOSIS — M5386 Other specified dorsopathies, lumbar region: Secondary | ICD-10-CM | POA: Diagnosis not present

## 2017-08-08 DIAGNOSIS — M9905 Segmental and somatic dysfunction of pelvic region: Secondary | ICD-10-CM | POA: Diagnosis not present

## 2017-08-09 DIAGNOSIS — M9905 Segmental and somatic dysfunction of pelvic region: Secondary | ICD-10-CM | POA: Diagnosis not present

## 2017-08-09 DIAGNOSIS — M5386 Other specified dorsopathies, lumbar region: Secondary | ICD-10-CM | POA: Diagnosis not present

## 2017-08-09 DIAGNOSIS — M9903 Segmental and somatic dysfunction of lumbar region: Secondary | ICD-10-CM | POA: Diagnosis not present

## 2017-08-13 DIAGNOSIS — M9905 Segmental and somatic dysfunction of pelvic region: Secondary | ICD-10-CM | POA: Diagnosis not present

## 2017-08-13 DIAGNOSIS — M5386 Other specified dorsopathies, lumbar region: Secondary | ICD-10-CM | POA: Diagnosis not present

## 2017-08-13 DIAGNOSIS — M9903 Segmental and somatic dysfunction of lumbar region: Secondary | ICD-10-CM | POA: Diagnosis not present

## 2017-08-15 DIAGNOSIS — M9903 Segmental and somatic dysfunction of lumbar region: Secondary | ICD-10-CM | POA: Diagnosis not present

## 2017-08-15 DIAGNOSIS — M5386 Other specified dorsopathies, lumbar region: Secondary | ICD-10-CM | POA: Diagnosis not present

## 2017-08-15 DIAGNOSIS — M9905 Segmental and somatic dysfunction of pelvic region: Secondary | ICD-10-CM | POA: Diagnosis not present

## 2017-08-15 DIAGNOSIS — M6281 Muscle weakness (generalized): Secondary | ICD-10-CM | POA: Diagnosis not present

## 2017-08-20 DIAGNOSIS — M9903 Segmental and somatic dysfunction of lumbar region: Secondary | ICD-10-CM | POA: Diagnosis not present

## 2017-08-20 DIAGNOSIS — M9905 Segmental and somatic dysfunction of pelvic region: Secondary | ICD-10-CM | POA: Diagnosis not present

## 2017-08-20 DIAGNOSIS — M5386 Other specified dorsopathies, lumbar region: Secondary | ICD-10-CM | POA: Diagnosis not present

## 2017-09-05 DIAGNOSIS — M6281 Muscle weakness (generalized): Secondary | ICD-10-CM | POA: Diagnosis not present

## 2017-09-12 DIAGNOSIS — M6281 Muscle weakness (generalized): Secondary | ICD-10-CM | POA: Diagnosis not present

## 2017-09-13 DIAGNOSIS — M5386 Other specified dorsopathies, lumbar region: Secondary | ICD-10-CM | POA: Diagnosis not present

## 2017-09-13 DIAGNOSIS — M9905 Segmental and somatic dysfunction of pelvic region: Secondary | ICD-10-CM | POA: Diagnosis not present

## 2017-09-13 DIAGNOSIS — M9903 Segmental and somatic dysfunction of lumbar region: Secondary | ICD-10-CM | POA: Diagnosis not present

## 2017-09-24 DIAGNOSIS — M6281 Muscle weakness (generalized): Secondary | ICD-10-CM | POA: Diagnosis not present

## 2017-09-24 DIAGNOSIS — N393 Stress incontinence (female) (male): Secondary | ICD-10-CM | POA: Diagnosis not present

## 2017-10-03 DIAGNOSIS — R2689 Other abnormalities of gait and mobility: Secondary | ICD-10-CM | POA: Diagnosis not present

## 2017-10-03 DIAGNOSIS — M25572 Pain in left ankle and joints of left foot: Secondary | ICD-10-CM | POA: Diagnosis not present

## 2017-10-29 ENCOUNTER — Ambulatory Visit: Payer: 59 | Admitting: Sports Medicine

## 2017-10-29 ENCOUNTER — Ambulatory Visit: Payer: Self-pay

## 2017-10-29 ENCOUNTER — Encounter: Payer: Self-pay | Admitting: Sports Medicine

## 2017-10-29 VITALS — BP 110/72 | HR 78 | Ht 65.0 in | Wt 195.4 lb

## 2017-10-29 DIAGNOSIS — M25572 Pain in left ankle and joints of left foot: Secondary | ICD-10-CM | POA: Diagnosis not present

## 2017-10-29 DIAGNOSIS — G8929 Other chronic pain: Secondary | ICD-10-CM

## 2017-10-29 NOTE — Progress Notes (Signed)
Anna Wade. Delor[facility] at [facility] [phone]  Anna Wade - 52 y.o. female MRN [id number]  Date of birth: Jan 21, 1966  Visit Date: 10/29/2017  PCP: Shaune Pollack, MD   Referred by: Shaune Pollack, MD   Scribe(s) for today's visit: Christoper Fabian, LAT, ATC  SUBJECTIVE:  Anna Wade is here for Initial Assessment (L ankle/foot pain)   HPI: Her L ankle and foot pain (post to L lateral malleolus and along lateral rearfoot) symptoms INITIALLY: Began about 4-5 months ago w/ no known MOI.  Pt notes that she has changed the way she walks to avoid as much weight bearing as possible along the lateral aspect of her L foot and ankle. Described as moderate-severe sharp pain w/ weight bearing, radiating to L lateral foot and across top of foot Worsened with inversion, weight bearing Improved with nothing noted Additional associated symptoms include: L lateral ankle swelling; no mechanical symptoms and no N/T into L foot    At this time symptoms are worsening compared to onset She has been icing and taking Aleve prn.  REVIEW OF SYSTEMS: Denies night time disturbances. Denies fevers, chills, or night sweats. Denies unexplained weight loss. Denies personal history of cancer. Denies changes in bowel or bladder habits. Denies recent unreported falls. Denies new or worsening dyspnea or wheezing. Denies headaches or dizziness.  Denies numbness, tingling or weakness  In the extremities.  Denies dizziness or presyncopal episodes Reports lower extremity edema - L ankle and foot   HISTORY:  Prior history reviewed and updated per electronic medical record.  Social History   Occupational History  . Not on file  Tobacco Use  . Smoking status: Former Smoker    Last attempt to quit: 04/29/2004    Years since quitting: 13.5  . Smokeless tobacco: Never Used  Substance and Sexual Activity  . Alcohol use: Not on file    Comment: 1x a month  beer  . Drug use: No  . Sexual activity: Not on file   Social History   Social History Narrative  . Not on file    No past medical history on file. Past Surgical History:  Procedure Laterality Date  . CESAREAN SECTION     x2   family history is not on file.  DATA OBTAINED & REVIEWED:  No results for input(s): HGBA1C, LABURIC, CREATINE in the last 8760 hours. .   OBJECTIVE:  VS:  HT:5\' 5"  (165.1 cm)   WT:195 lb 6.4 oz (88.6 kg)  BMI:32.52    BP:110/72  HR:78bpm  TEMP: ( )  RESP:94 %   PHYSICAL EXAM: CONSTITUTIONAL: Well-developed, Well-nourished and In no acute distress PSYCHIATRIC: Alert & appropriately interactive. and Not depressed or anxious appearing. RESPIRATORY: No increased work of breathing and Trachea Midline EYES: Pupils are equal., EOM intact without nystagmus. and No scleral icterus.  VASCULAR EXAM: Warm and well perfused NEURO: unremarkable Normal associated myotomal distribution strength to manual muscle testing Normal sensation to light touch  MSK Exam: Left ankle  Well aligned, no significant deformity. No overlying skin changes. TTP over Anterior medial and anterior syndesmosis.   Non tender over Peroneal tendons, base of the fifth metatarsal, navicular, anterior medial joint space.   RANGE OF MOTION & STRENGTH  Good intrinsic ankle dorsiflexion, plantarflexion, inversion and eversion strength and range of motion.   SPECIALITY TESTING:  Slight laxity with anterior drawer testing but solid endpoint.  Mild pain with talar tilt and Klieger testing.  No significant pain with passive dorsiflexion.  Negative cotton test    ASSESSMENT   1. Chronic pain of left ankle     PLAN:  Pertinent additional documentation may be included in corresponding procedure notes, imaging studies, problem based documentation and patient instructions.  Procedures:  . US Guided Injection per procedure note  Medications:  No orders of the defined types were  placed in this encounter.  Discussion/Instructions: No problem-specific Assessment & Plan notes found for this encounter.  . Should do well with injection.  Symptoms are consistent with anterior ankle impingement.  She does have some evidence of peroneal tendinopathy and could consider nitroglycerin protocol if any persistent symptoms. . Body Helix Compression Sleeve provided today per AVS . Discussed red flag symptoms that warrant earlier emergent evaluation and patient voices understanding. . Activity modifications and the importance of avoiding exacerbating activities (limiting pain to no more than a 4 / 10 during or following activity) recommended and discussed.  Follow-up:  . Return in about 6 weeks (around 12/10/2017).   . If any lack of improvement consider: further diagnostic evaluation with X-rays at follow-up  . At follow up will plan to consider: Nitroglycerin protocol for peroneal tendinopathy however only minimal pain over this region today and more pain anterior laterally.     CMA/ATC served as Neurosurgeon during this visit. History, Physical, and Plan performed by medical provider. Documentation and orders reviewed and attested to.      Andrena Mews, DO    [place] [facility] Physician

## 2017-10-29 NOTE — Patient Instructions (Addendum)
You had an injection today.  Things to be aware of after injection are listed below: . You may experience no significant improvement or even a slight worsening in your symptoms during the first 24 to 48 hours.  After that we expect your symptoms to improve gradually over the next 2 weeks for the medicine to have its maximal effect.  You should continue to have improvement out to 6 weeks after your injection. . Dr. Rigby recommends icing the site of the injection for 20 minutes  1-2 times the day of your injection . You may shower but no swimming, tub bath or Jacuzzi for 24 hours. . If your bandage falls off this does not need to be replaced.  It is appropriate to remove the bandage after 4 hours. . You may resume light activities as tolerated unless otherwise directed per Dr. Rigby during your visit  POSSIBLE STEROID SIDE EFFECTS:  Side effects from injectable steroids tend to be less than when taken orally however you may experience some of the symptoms listed below.  If experienced these should only last for a short period of time. Change in menstrual flow  Edema (swelling)  Increased appetite Skin flushing (redness)  Skin rash/acne  Thrush (oral) Yeast vaginitis    Increased sweating  Depression Increased blood glucose levels Cramping and leg/calf  Euphoria (feeling happy)  POSSIBLE PROCEDURE SIDE EFFECTS: The side effects of the injection are usually fairly minimal however if you may experience some of the following side effects that are usually self-limited and will is off on their own.  If you are concerned please feel free to call the office with questions:  Increased numbness or tingling  Nausea or vomiting  Swelling or bruising at the injection site   Please call our office if if you experience any of the following symptoms over the next week as these can be signs of infection:   Fever greater than 100.5F  Significant swelling at the injection site  Significant redness or drainage  from the injection site  If after 2 weeks you are continuing to have worsening symptoms please call our office to discuss what the next appropriate actions should be including the potential for a return office visit or other diagnostic testing.   I recommend you obtained a compression sleeve to help with your joint problems. There are many options on the market however I recommend obtaining a ankle Body Helix compression sleeve.  You can find information (including how to appropriate measure yourself for sizing) can be found at www.Body [email or web address].[email or web address].  Many of these products are health savings account (HSA) eligible.   You can use the compression sleeve at any time throughout the day but is most important to use while being active as well as for 2 hours post-activity.   It is appropriate to ice following activity with the compression sleeve in place.   

## 2017-10-29 NOTE — Procedures (Signed)
PROCEDURE NOTE:  Ultrasound Guided: Injection: Left ankle Images were obtained and interpreted by myself, Gaspar Bidding, DO  Images have been saved and stored to PACS system. Images obtained on: GE S7 Ultrasound machine    ULTRASOUND FINDINGS:  Peroneal tendons are swollen and Tendinopathic appearing with a small amount of fluid around them but this is mild and nonpainful.  Ankle joint does have a small effusion with an intact ATFL.  DESCRIPTION OF PROCEDURE:  The patient's clinical condition is marked by substantial pain and/or significant functional disability. Other conservative therapy has not provided relief, is contraindicated, or not appropriate. There is a reasonable likelihood that injection will significantly improve the patient's pain and/or functional impairment.   After discussing the risks, benefits and expected outcomes of the injection and all questions were reviewed and answered, the patient wished to undergo the above named procedure.  Verbal consent was obtained.  The ultrasound was used to identify the target structure and adjacent neurovascular structures. The skin was then prepped in sterile fashion and the target structure was injected under direct visualization using sterile technique as below:  Single injection performed as below: PREP: Alcohol and Ethel Chloride APPROACH:anteriolateral, single injection, 25g 1.5 in. INJECTATE: 1 cc 0.5% Marcaine and 1 cc 40mg /mL DepoMedrol ASPIRATE: None DRESSING: Band-Aid and Body Helix Compression Sleeve: Full ankle sleeve  Post procedural instructions including recommending icing and warning signs for infection were reviewed.    This procedure was well tolerated and there were no complications.   IMPRESSION: Succesful Ultrasound Guided: Injection

## 2017-12-06 NOTE — Progress Notes (Signed)
Anna Wade. Anna Wade Sports Medicine [facility] at [facility] [phone]  Anna Wade - 52 y.o. female MRN [id number]  Date of birth: 26-May-1965  Visit Date: 12/07/2017  PCP: Shaune Pollack, MD   Referred by: Shaune Pollack, MD   Scribe(s) for today's visit: Stevenson Clinch, CMA  SUBJECTIVE:  Anna Wade is here for Follow-up (L ankle pain)   HPI: 10/29/2017: Her L ankle and foot pain (post to L lateral malleolus and along lateral rearfoot) symptoms INITIALLY: Began about 4-5 months ago w/ no known MOI.  Pt notes that she has changed the way she walks to avoid as much weight bearing as possible along the lateral aspect of her L foot and ankle. Described as moderate-severe sharp pain w/ weight bearing, radiating to L lateral foot and across top of foot Worsened with inversion, weight bearing Improved with nothing noted Additional associated symptoms include: L lateral ankle swelling; no mechanical symptoms and no N/T into L foot   At this time symptoms are worsening compared to onset She has been icing and taking Aleve prn.  12/07/2017: Compared to the last office visit, her previously described symptoms show no change. Current symptoms are moderate-severe & are radiating to to the lateral foot and across the top of her foot. She has slight daily swelling in the ankle.  She has been taking Aleve and icing prn. She received steroid injection 10/29/2017 and received a few days of relief. Sx started to flare up again a few days after the injection. She has been wearing Body Helix compression sleeve, she has some relief while wearing the compression but sx return when taking it off. She feels like the sleeve just "masked the problem a little bit".    REVIEW OF SYSTEMS: Denies night time disturbances. Denies fevers, chills, or night sweats. Denies unexplained weight loss. Denies personal history of cancer. Denies changes in bowel or bladder habits. Denies  recent unreported falls. Denies new or worsening dyspnea or wheezing. Denies headaches or dizziness.  Denies numbness, tingling or weakness in the extremities.  Denies dizziness or presyncopal episodes Reports lower extremity edema - L ankle and foot   HISTORY:  Prior history reviewed and updated per electronic medical record.  Social History   Occupational History  . Not on file  Tobacco Use  . Smoking status: Former Smoker    Last attempt to quit: 04/29/2004    Years since quitting: 13.6  . Smokeless tobacco: Never Used  Substance and Sexual Activity  . Alcohol use: Not on file    Comment: 1x a month beer  . Drug use: No  . Sexual activity: Not on file   Social History   Social History Narrative  . Not on file    History reviewed. No pertinent past medical history. Past Surgical History:  Procedure Laterality Date  . CESAREAN SECTION     x2   family history is not on file.  DATA OBTAINED & REVIEWED:  No results for input(s): HGBA1C, LABURIC, CREATINE in the last 8760 hours. .   OBJECTIVE:  VS:  HT:5\' 5"  (165.1 cm)   WT:196 lb (88.9 kg)  BMI:32.62    BP:118/82  HR:72bpm  TEMP: ( )  RESP:97 %   PHYSICAL EXAM: CONSTITUTIONAL: Well-developed, Well-nourished and In no acute distress PSYCHIATRIC: Alert & appropriately interactive. and Not depressed or anxious appearing. RESPIRATORY: No increased work of breathing and Trachea Midline EYES: Pupils are equal., EOM intact without nystagmus. and No  scleral icterus.  VASCULAR EXAM: Warm and well perfused NEURO: unremarkable Normal associated myotomal distribution strength to manual muscle testing Normal sensation to light touch  MSK Exam: Left ankle  Well aligned, no significant deformity. No overlying skin changes. TTP over Anterior medial and anterior syndesmosis.   Non tender over Peroneal tendons, base of the fifth metatarsal, navicular, anterior medial joint space.   RANGE OF MOTION & STRENGTH  Good  intrinsic ankle dorsiflexion, plantarflexion, inversion and eversion strength and range of motion.   SPECIALITY TESTING:  laxity with anterior drawer testing but solid endpoint.  Mild pain with talar tilt and Klieger testing.  No significant pain with passive dorsiflexion.  Negative cotton test    ASSESSMENT   1. Chronic pain of left ankle   2. Ankle impingement syndrome, left     PLAN:  Pertinent additional documentation may be included in corresponding procedure notes, imaging studies, problem based documentation and patient instructions.  Procedures:  . None  Medications:  No orders of the defined types were placed in this encounter.  Discussion/Instructions: Chronic pain of left ankle Symptoms are consistent with ankle impingement versus peroneal tendinopathy versus ankle instability.  MRI of the ankle indicated at this time given lack of improvement with conservative measures including intra-articular injection, bracing,  Plan follow-up after MRI and discuss options.  If purely peroneal tendinopathy could consider nitroglycerin protocol.  If surgical discussed referral to Dr. Aldean Baker.  . Only minimal improvement with injection.  She still has some soft tissue swelling and pain over the ankle.  Further diagnostic evaluation with MRI indicated at this time.  Continue with Body Helix Compression Sleeve . Further Testing ordered: MRI ankle . Body Helix Compression Sleeve provided today per AVS . Discussed red flag symptoms that warrant earlier emergent evaluation and patient voices understanding. . Activity modifications and the importance of avoiding exacerbating activities (limiting pain to no more than a 4 / 10 during or following activity) recommended and discussed.  Follow-up:  . Return for MRI review in person.    . At follow up will plan to consider: Nitroglycerin protocol for peroneal tendinopathy however only minimal pain over this region today and more pain  anterior laterally.     CMA/ATC served as Neurosurgeon during this visit. History, Physical, and Plan performed by medical provider. Documentation and orders reviewed and attested to.      Andrena Mews, DO    [place] [facility] Physician

## 2017-12-07 ENCOUNTER — Ambulatory Visit: Payer: 59 | Admitting: Sports Medicine

## 2017-12-07 ENCOUNTER — Ambulatory Visit (INDEPENDENT_AMBULATORY_CARE_PROVIDER_SITE_OTHER): Payer: 59

## 2017-12-07 ENCOUNTER — Encounter: Payer: Self-pay | Admitting: Sports Medicine

## 2017-12-07 VITALS — BP 118/82 | HR 72 | Ht 65.0 in | Wt 196.0 lb

## 2017-12-07 DIAGNOSIS — G8929 Other chronic pain: Secondary | ICD-10-CM

## 2017-12-07 DIAGNOSIS — M25872 Other specified joint disorders, left ankle and foot: Secondary | ICD-10-CM

## 2017-12-07 DIAGNOSIS — M25572 Pain in left ankle and joints of left foot: Secondary | ICD-10-CM

## 2017-12-07 NOTE — Addendum Note (Signed)
Addended by: Dierdre Searles on: 12/07/2017 09:05 AM   Modules accepted: Orders

## 2017-12-07 NOTE — Patient Instructions (Signed)
We are ordering an MRI for you today.  The imaging office will be calling you to schedule your appointment after we obtain authorization from your insurance company.   Please be sure you have signed up for MyChart so that we can get your results to you.  We will be in touch with you as soon as we can.  Please know, it can take up to 3-4 business days for the radiologist and Dr. Meeyah Ovitt to have time to review the results and determine the best appropriate action.  If there is something that appears to be surgical or needs a referral to other specialists we will let you know through MyChart or telephone.  Otherwise we will plan to schedule a follow up appointment with Dr. Iviana Blasingame once we have the results.   

## 2017-12-07 NOTE — Assessment & Plan Note (Signed)
Symptoms are consistent with ankle impingement versus peroneal tendinopathy versus ankle instability.  MRI of the ankle indicated at this time given lack of improvement with conservative measures including intra-articular injection, bracing,  Plan follow-up after MRI and discuss options.  If purely peroneal tendinopathy could consider nitroglycerin protocol.  If surgical discussed referral to Dr. Aldean Baker.

## 2017-12-18 ENCOUNTER — Ambulatory Visit
Admission: RE | Admit: 2017-12-18 | Discharge: 2017-12-18 | Disposition: A | Discharge status: Home / Self Care | Payer: 59 | Source: Ambulatory Visit | Attending: Sports Medicine | Admitting: Sports Medicine

## 2017-12-18 DIAGNOSIS — M25572 Pain in left ankle and joints of left foot: Principal | ICD-10-CM

## 2017-12-18 DIAGNOSIS — M25872 Other specified joint disorders, left ankle and foot: Secondary | ICD-10-CM

## 2017-12-18 DIAGNOSIS — R600 Localized edema: Secondary | ICD-10-CM | POA: Diagnosis not present

## 2017-12-18 DIAGNOSIS — G8929 Other chronic pain: Secondary | ICD-10-CM

## 2017-12-21 ENCOUNTER — Encounter: Payer: Self-pay | Admitting: Sports Medicine

## 2017-12-21 ENCOUNTER — Ambulatory Visit: Payer: 59 | Admitting: Sports Medicine

## 2017-12-21 VITALS — BP 120/78 | HR 92 | Ht 65.0 in | Wt 196.6 lb

## 2017-12-21 DIAGNOSIS — M25572 Pain in left ankle and joints of left foot: Secondary | ICD-10-CM | POA: Diagnosis not present

## 2017-12-21 DIAGNOSIS — G8929 Other chronic pain: Secondary | ICD-10-CM | POA: Diagnosis not present

## 2017-12-21 MED ORDER — NITROGLYCERIN 0.2 MG/HR TD PT24: MEDICATED_PATCH | TRANSDERMAL | 1 refills | Status: DC

## 2017-12-21 NOTE — Patient Instructions (Addendum)
Nitroglycerin Protocol   Apply 1/4 nitroglycerin patch to affected area daily.  Change position of patch within the affected area every 24 hours.  You may experience a headache during the first 1-2 weeks of using the patch, these should subside.  If you experience headaches after beginning nitroglycerin patch treatment, you may take your preferred over the counter pain reliever.  Another side effect of the nitroglycerin patch is skin irritation or rash related to patch adhesive.  Please notify our office if you develop more severe headaches or rash, and stop the patch.  Tendon healing with nitroglycerin patch may require 12 to 24 weeks depending on the extent of injury.  Men should not use if taking Viagra, Cialis, or Levitra.   Do not use if you have migraines or rosacea.    Please perform the exercise program that we have prepared for you and gone over in detail on a daily basis.  In addition to the handout you were provided you can access your program through: [email or web address]   Your unique program code is:  [id number]

## 2017-12-21 NOTE — Progress Notes (Signed)
PROCEDURE NOTE: THERAPEUTIC EXERCISES (97110) 15 minutes spent for Therapeutic exercises as below and as referenced in the AVS.  This included exercises focusing on stretching, strengthening, with significant focus on eccentric aspects.   Proper technique shown and discussed handout in great detail with ATC.  All questions were discussed and answered.   Long term goals include an improvement in range of motion, strength, endurance as well as avoiding reinjury. Frequency of visits is one time as determined during today's  office visit. Frequency of exercises to be performed is as per handout.  EXERCISES REVIEWED:  4 way ANKLE exercises  [place] walking

## 2017-12-21 NOTE — Progress Notes (Signed)
Anna Wade. [facility] at [facility] [phone]  Anna Wade - 52 y.o. female MRN [id number]  Date of birth: [birth year]  Visit Date: 12/21/2017  PCP: Shaune Pollack, MD (Inactive)   Referred by: Shaune Pollack, MD   Scribe(s) for today's visit: Christoper Fabian, LAT, ATC  SUBJECTIVE:  Anna Wade is here for Follow-up (L ankle)   HPI: 10/29/2017: Her L ankle and foot pain (post to L lateral malleolus and along lateral rearfoot) symptoms INITIALLY: Began about 4-5 months ago w/ no known MOI.  Pt notes that she has changed the way she walks to avoid as much weight bearing as possible along the lateral aspect of her L foot and ankle. Described as moderate-severe sharp pain w/ weight bearing, radiating to L lateral foot and across top of foot Worsened with inversion, weight bearing Improved with nothing noted Additional associated symptoms include: L lateral ankle swelling; no mechanical symptoms and no N/T into L foot   At this time symptoms are worsening compared to onset She has been icing and taking Aleve prn.  12/07/2017: Compared to the last office visit, her previously described symptoms show no change. Current symptoms are moderate-severe & are radiating to to the lateral foot and across the top of her foot. She has slight daily swelling in the ankle.  She has been taking Aleve and icing prn. She received steroid injection 10/29/2017 and received a few days of relief. Sx started to flare up again a few days after the injection. She has been wearing Body Helix compression sleeve, she has some relief while wearing the compression but sx return when taking it off. She feels like the sleeve just "masked the problem a little bit".   12/21/2017: Compared to the last office visit on 12/07/17, her previously described L ankle symptoms show no change. She is here today to review her L ankle MRI results. Current symptoms are  moderate-severe & are radiating to R lateral foot and across the top of her foot.  She con't to note just distal to the L lateral malleolus She has been taking Aleve prn and icing her L foot.  She has been wearing her L ankle Body Helix.  She had a steroid injection on 10/29/17 that only provided temporary relief.  L ankle XR - 12/07/17 L ankle MRI - 12/18/17  REVIEW OF SYSTEMS: Denies night time disturbances. Denies fevers, chills, or night sweats. Denies unexplained weight loss. Denies personal history of cancer. Denies changes in bowel or bladder habits. Denies recent unreported falls. Denies new or worsening dyspnea or wheezing. Denies headaches or dizziness.  Denies numbness, tingling or weakness in the extremities.  Denies dizziness or presyncopal episodes Reports lower extremity edema - L ankle and foot   HISTORY:  Prior history reviewed and updated per electronic medical record.  Social History   Occupational History  . Not on file  Tobacco Use  . Smoking status: Former Smoker    Last attempt to quit: 04/29/2004    Years since quitting: 13.8  . Smokeless tobacco: Never Used  Substance and Sexual Activity  . Alcohol use: Not on file    Comment: 1x a month beer  . Drug use: No  . Sexual activity: Not on file   Social History   Social History Narrative  . Not on file    No past medical history on file. Past Surgical History:  Procedure Laterality Date  . CESAREAN SECTION  x2   family history is not on file.  DATA OBTAINED & REVIEWED:  No results for input(s): HGBA1C, LABURIC, CREATINE in the last 8760 hours. .   OBJECTIVE:  VS:  HT:5\' 5"  (165.1 cm)   WT:196 lb 9.6 oz (89.2 kg)  BMI:32.72    BP:120/78  HR:92bpm  TEMP: ( )  RESP:96 %   PHYSICAL EXAM: CONSTITUTIONAL: Well-developed, Well-nourished and In no acute distress PSYCHIATRIC: Alert & appropriately interactive. and Not depressed or anxious appearing. RESPIRATORY: No increased work of  breathing and Trachea Midline EYES: Pupils are equal., EOM intact without nystagmus. and No scleral icterus.  VASCULAR EXAM: Warm and well perfused NEURO: unremarkable  MSK Exam: Left ankle  Well aligned, no significant deformity. No overlying skin changes. TTP over Anterior medial and anterior syndesmosis.   Non tender over Peroneal tendons, base of the fifth metatarsal, navicular, anterior medial joint space.   RANGE OF MOTION & STRENGTH  Good intrinsic ankle dorsiflexion, plantarflexion, inversion and eversion strength and range of motion.   SPECIALITY TESTING:  laxity with anterior drawer testing but solid endpoint.  Mild pain with talar tilt and Klieger testing.  No significant pain with passive dorsiflexion.  Negative cotton test      ASSESSMENT   1. Chronic pain of left ankle      PROCEDURES:  PROCEDURE NOTE: THERAPEUTIC EXERCISES ([id number])  Please see procedure note     PLAN:  Pertinent additional documentation may be included in corresponding procedure notes, imaging studies, problem based documentation and patient instructions.  Chronic pain of left ankle Multifactorial ankle pain.  Main issue is tenosynovitis of the peroneal tendons.  Home Therapeutic exercises prescribed today per procedure note.  NITRO PROTOCOL - Discussed options with the patient today including biologic treatment with topical nitroglycerin. Patient has no contraindications & understands the risks, benefits and intentions of treatment. Emphasized the importance of rotating sites as well as appropriate and expected adverse reactions including orthostasis, headache, adhesive sensitivity.   RICE (Rest, ICE, Compression, Elevation) principles reviewed with the patient.  Activity modifications and the importance of avoiding exacerbating activities (limiting pain to no more than a 4 / 10 during or following activity) recommended and discussed.  Discussed red flag symptoms that warrant earlier  emergent evaluation and patient voices understanding.   Meds ordered this encounter  Medications  . nitroGLYCERIN (NITRODUR - DOSED IN MG/24 HR) 0.2 mg/hr patch    Sig: Place 1/4 to 1/2 of a patch over affected region. Remove and replace once daily.  Slightly alter skin placement daily    Dispense:  30 patch    Refill:  1    For musculoskeletal purposes.  Okay to cut patch.   Lab Orders  No laboratory test(s) ordered today   Imaging Orders  No imaging studies ordered today   Referral Orders  No referral(s) requested today    Return in about 6 weeks (around 02/01/2018).     CMA/ATC served as Neurosurgeonscribe during this visit. History, Physical, and Plan performed by medical provider. Documentation and orders reviewed and attested to.      Andrena MewsMichael D Joane Postel, DO    [place] [facility] Physician

## 2018-01-29 ENCOUNTER — Ambulatory Visit: Payer: Self-pay

## 2018-01-29 ENCOUNTER — Encounter: Payer: Self-pay | Admitting: Sports Medicine

## 2018-01-29 ENCOUNTER — Ambulatory Visit: Payer: 59 | Admitting: Sports Medicine

## 2018-01-29 VITALS — BP 120/78 | HR 83 | Ht 65.0 in | Wt 197.2 lb

## 2018-01-29 DIAGNOSIS — M7672 Peroneal tendinitis, left leg: Secondary | ICD-10-CM | POA: Diagnosis not present

## 2018-01-29 DIAGNOSIS — G8929 Other chronic pain: Secondary | ICD-10-CM | POA: Diagnosis not present

## 2018-01-29 DIAGNOSIS — M25572 Pain in left ankle and joints of left foot: Secondary | ICD-10-CM

## 2018-01-29 NOTE — Patient Instructions (Signed)

## 2018-01-29 NOTE — Progress Notes (Signed)
Anna FellsMichael D. Delorise Shinerigby, DO  [facility] [phone]  Anna BandyLori C Wade - 52 y.o. female MRN [id number]  Date of birth: 1965/12/29  Visit Date:   PCP: Shaune PollackGates, Donna, MD   Referred by: Shaune PollackGates, Donna, MD   SUBJECTIVE:  Chief Complaint  Patient presents with  . Follow-up    L ankle pain - Prior injection on 10/29/17; current Nitro protocol; has HEP; Body Helix    HPI: Patient presents for follow-up of her left ankle pain.  She is here 6 weeks after starting nitroglycerin protocol and reports actual worsening and popping at the top lateral aspect of her left foot.  She is having a lump over the left lateral malleolus along the posterior aspect.  She is status post intra-articular ankle injection on 10/29/2017 as well as 6 weeks and nitroglycerin protocol.  She is been using a Body Helix Compression Sleeve compression sleeve with four-way ankle and toe walking home therapeutic exercises with no significant improvement.  REVIEW OF SYSTEMS: Denies fevers, chills, recent weight gain or weight loss.  No night sweats. No significant nighttime awakenings due to this issue.  HISTORY:  Prior history reviewed and updated per electronic medical record.  Social History   Occupational History  . Not on file  Tobacco Use  . Smoking status: Former Smoker    Last attempt to quit: 04/29/2004    Years since quitting: 13.7  . Smokeless tobacco: Never Used  Substance and Sexual Activity  . Alcohol use: Not on file    Comment: 1x a month beer  . Drug use: No  . Sexual activity: Not on file   Social History   Social History Narrative  . Not on file      DATA OBTAINED & REVIEWED:  No results for input(s): HGBA1C, LABURIC, CREATINE, CALCIUM, AST, ALT, TSH in the last 8760 hours.  Invalid input(s): MAGNESIUM, CK No problems updated. No specialty comments available.  OBJECTIVE:  VS:  HT:5\' 5"  (165.1 cm)   WT:197 lb 3.2 oz (89.4 kg)  BMI:32.82     BP:120/78  HR:83bpm  TEMP: ( )  RESP:94 %   PHYSICAL EXAM: Adult female.  No acute distress.  Alert and appropriate.  Left foot and ankle are overall well aligned with a large amount of swelling over the lateral ankle mainly along the peroneal tendon sheath.  She is tender to palpation in this region.  Her ankle joint has minimal pain with palpation of the joint line.  Intrinsic ankle strength is 5/5 but painful with resisted ankle eversion.  Ankle drawer testing is stable.   ASSESSMENT  1. Chronic pain of left ankle   2. Peroneal tendinitis of left lower extremity     PLAN:  Pertinent additional documentation may be included in corresponding procedure notes, imaging studies, problem based documentation and patient instructions.  Procedures:  US Guided Injection per procedure note  Medications:  No orders of the defined types were placed in this encounter.   Discussion/Instructions: No problem-specific Assessment & Plan notes found for this encounter.  THERAPEUTIC EXERCISE: Discussed the foundation of treatment for this condition is physical therapy and/or daily (5-6 days/week) therapeutic exercises, focusing on core strengthening, coordination, neuromuscular control/reeducation.  Continue previously prescribed home exercise program.  TENDINOPATHY - Discussed that the anticipated amount of time for healing is 12- 24 weeks for Tendinopathic changes.  Emphasized the importance of improving blood flow as well as eccentric loading of the tendon. Brace/supportive device provided  per AVS. Lace up ankle brace X 2 weeks minimum Discussed appropriate use of both heat and ice with the patient today.  Discussed red flag symptoms that warrant earlier emergent evaluation and patient voices understanding. Activity modifications and the importance of avoiding exacerbating activities (limiting pain to no more than a 4 / 10 during or following activity) recommended and discussed.  Today we  discussed multiple options including tendon sheath injection, continue nitroglycerin protocol, PRP and/or referral for surgical intervention.  This time corticosteroid injection with resumption of nitroglycerin protocol in 2 weeks will be pursued but if any worsening features could consider surgical options.  If any lack of improvement: consider further diagnostic evaluation with repeat MSK US   Return in about 6 weeks (around 03/12/2018) for repeat diagnostic ultrasound.          Anna MewsMichael D Rigby, DO    [name] Physician

## 2018-01-29 NOTE — Procedures (Signed)
PROCEDURE NOTE:  Ultrasound Guided: Injection: Left ankle, peroneal Tendon Sheath Images were obtained and interpreted by myself, Gaspar BiddingMichael Shelley Cocke, DO  Images have been saved and stored to PACS system. Images obtained on: GE S7 Ultrasound machine    ULTRASOUND FINDINGS:  Marked thickening of the peroneal tendons with marked tenosynovitis and interstitial swelling, splitting and Tendinopathic changes of the peroneal tendons.    DESCRIPTION OF PROCEDURE:  The patient's clinical condition is marked by substantial pain and/or significant functional disability. Other conservative therapy has not provided relief, is contraindicated, or not appropriate. There is a reasonable likelihood that injection will significantly improve the patient's pain and/or functional impairment.   After discussing the risks, benefits and expected outcomes of the injection and all questions were reviewed and answered, the patient wished to undergo the above named procedure.  Verbal consent was obtained.  The ultrasound was used to identify the target structure and adjacent neurovascular structures. The skin was then prepped in sterile fashion and the target structure was injected under direct visualization using sterile technique as below:  Single injection performed as below: PREP: Alcohol and Ethel Chloride APPROACH:direct, single injection, 25g 1.5 in. INJECTATE: 1 cc 0.5% Marcaine and 1 cc 40mg /mL DepoMedrol ASPIRATE: None DRESSING: Band-Aid  Post procedural instructions including recommending icing and warning signs for infection were reviewed.    This procedure was well tolerated and there were no complications.   IMPRESSION: Succesful Ultrasound Guided: Injection

## 2018-02-01 ENCOUNTER — Ambulatory Visit: Payer: 59 | Admitting: Sports Medicine

## 2018-02-24 ENCOUNTER — Encounter: Payer: Self-pay | Admitting: Sports Medicine

## 2018-02-24 NOTE — Assessment & Plan Note (Signed)
Multifactorial ankle pain.  Main issue is tenosynovitis of the peroneal tendons.

## 2018-03-14 ENCOUNTER — Encounter: Payer: Self-pay | Admitting: Sports Medicine

## 2018-03-14 ENCOUNTER — Ambulatory Visit: Payer: 59 | Admitting: Sports Medicine

## 2018-03-14 VITALS — BP 110/80 | HR 88 | Ht 65.0 in | Wt 192.2 lb

## 2018-03-14 DIAGNOSIS — G8929 Other chronic pain: Secondary | ICD-10-CM | POA: Diagnosis not present

## 2018-03-14 DIAGNOSIS — M25572 Pain in left ankle and joints of left foot: Secondary | ICD-10-CM

## 2018-03-14 DIAGNOSIS — M25872 Other specified joint disorders, left ankle and foot: Secondary | ICD-10-CM

## 2018-03-14 DIAGNOSIS — M7672 Peroneal tendinitis, left leg: Secondary | ICD-10-CM | POA: Diagnosis not present

## 2018-03-14 NOTE — Patient Instructions (Addendum)
The cost of the pair of custom orthotics is $195.  You can look into having your insurance company cover the cost of these. Some insurance companies cover the cost and other do not.  If they do not you will be responsible for the full cost of the orthotics.  I am happy to do these for you at any time, you just need to let our front office schedulers know you would like an "orthotic appointment."  Please also make sure you bring athletic shoes with you on the day of your orthotic appointment or whatever shoes you plan to wear your orthotics in most frequently.   When you call your insurance company you will need to provide them the CPT code which is L3030 and there are 2 units.  You can call them  and ask if this is covered.        

## 2018-03-14 NOTE — Progress Notes (Signed)
Veverly FellsMichael D. Delorise Shinerigby, DO  [facility] [phone]  Lowella BandyLori C Lamm - 53 y.o. female MRN [id number]  Date of birth: [birth year]  Visit Date: March 14, 2018  PCP: Shaune PollackGates, Donna, MD (Inactive)   Referred by: Shaune PollackGates, Donna, MD  SUBJECTIVE:  Chief Complaint  Patient presents with  . Left Ankle - Follow-up    HPI: Lawson FiscalLori is here for follow-up of left ankle pain.  Overall she is 50% improved.  She is having some pain that radiates to the dorsum of the foot but this is minimal.  She is status post injection on 01/29/2018 into the peroneal sheath.  She is using nitroglycerin protocol.  Continue to use the Body Helix Compression Sleeve.  She is performing her home therapeutic exercises 3 days a week.  REVIEW OF SYSTEMS: No significant nighttime awakenings due to this issue. Denies fevers, chills, recent weight gain or weight loss.  No night sweats.  Pt denies any change in bowel or bladder habits, muscle weakness, numbness or falls associated with this pain.  HISTORY:  Prior history reviewed and updated per electronic medical record.  Patient Active Problem List   Diagnosis Date Noted  . Chronic pain of left ankle 12/07/2017    1. Tenosynovitis suspected about the peroneal tendons with overlying soft tissue subcutaneous edema and induration of Kager's fat pad. Intrasubstance split tear of the peroneal brevis with tendinosis of the peroneal longus tendons. 2. Minimal retrocalcaneal bursitis. 3. No acute osseous abnormality is noted. There is however faint marrow edema of the third through fifth metatarsals which may be reactive.    Social History   Occupational History  . Not on file  Tobacco Use  . Smoking status: Former Smoker    Last attempt to quit: 04/29/2004    Years since quitting: 13.8  . Smokeless tobacco: Never Used  Substance and Sexual Activity  . Alcohol use: Not on file    Comment: 1x a month beer  . Drug use: No  .  Sexual activity: Not on file   Social History   Social History Narrative  . Not on file    OBJECTIVE:  VS:  HT:5\' 5"  (165.1 cm)   WT:192 lb 3.2 oz (87.2 kg)  BMI:31.98    BP:110/80  HR:88bpm  TEMP: ( )  RESP:98 %   PHYSICAL EXAM: Adult female. No acute distress.  Alert and appropriate. Left ankle is overall well aligned.  She has pain over the peroneal tendons behind the lateral malleolus but this is improved and significantly less swollen than in the past.  Her base of the fifth metatarsal is pain-free.  Some pain over the lateral midfoot with direct palpation but no crepitation.  Ankle drawer testing is stable.  She does walk with a rigid wide-based gait with a slightly externally rotated right foot.   ASSESSMENT:   1. Chronic pain of left ankle   2. Peroneal tendinitis of left lower extremity   3. Ankle impingement syndrome, left     PROCEDURES:  None  PLAN:  Pertinent additional documentation may be included in corresponding procedure notes, imaging studies, problem based documentation and patient instructions.  No problem-specific Assessment & Plan notes found for this encounter.   We discussed multiple options with her today.  Given the persistent ongoing pain and slightly abnormal gait she will benefit from custom cushion insoles and will follow-up to have these fabricated at her convenience.  We will likely do within EVA to  allow her to wear these and more shoes than her athletic shoes.  Continue previously prescribed home exercise program.   TENDINOPATHY - Discussed that the anticipated amount of time for healing is 12- 24 weeks for Tendinopathic changes.  Emphasized the importance of improving blood flow as well as eccentric loading of the tendon.   Activity modifications and the importance of avoiding exacerbating activities (limiting pain to no more than a 4 / 10 during or following activity) recommended and discussed.  Discussed red flag symptoms that  warrant earlier emergent evaluation and patient voices understanding.   No orders of the defined types were placed in this encounter.  Lab Orders  No laboratory test(s) ordered today   Imaging Orders  No imaging studies ordered today   Referral Orders  No referral(s) requested today    No follow-up will fabricate custom cushioned insoles and then have her follow-up for a 6-week return office visit with repeat ultrasound at that time.  Discussed if any lack of improvement with these interventions further treatment could be considered from a surgical standpoint versus PRP injection.  Return in about 6 weeks (around 04/25/2018), or for custom orthotics at your convenience.          Andrena MewsMichael D Eleonor Ocon, DO    [place] [facility] Physician

## 2018-04-02 DIAGNOSIS — M4716 Other spondylosis with myelopathy, lumbar region: Secondary | ICD-10-CM | POA: Diagnosis not present

## 2018-04-02 DIAGNOSIS — M9903 Segmental and somatic dysfunction of lumbar region: Secondary | ICD-10-CM | POA: Diagnosis not present

## 2018-04-02 DIAGNOSIS — M5386 Other specified dorsopathies, lumbar region: Secondary | ICD-10-CM | POA: Diagnosis not present

## 2018-04-10 DIAGNOSIS — M545 Low back pain: Secondary | ICD-10-CM | POA: Diagnosis not present

## 2018-04-23 DIAGNOSIS — R399 Unspecified symptoms and signs involving the genitourinary system: Secondary | ICD-10-CM | POA: Diagnosis not present

## 2018-04-25 ENCOUNTER — Encounter: Payer: Self-pay | Admitting: Sports Medicine

## 2018-04-25 ENCOUNTER — Ambulatory Visit: Payer: 59 | Admitting: Sports Medicine

## 2018-04-25 ENCOUNTER — Ambulatory Visit: Payer: Self-pay

## 2018-04-25 ENCOUNTER — Other Ambulatory Visit: Payer: Self-pay

## 2018-04-25 VITALS — BP 118/84 | HR 78 | Temp 98.1°F | Ht 65.0 in | Wt 196.0 lb

## 2018-04-25 DIAGNOSIS — M25572 Pain in left ankle and joints of left foot: Principal | ICD-10-CM

## 2018-04-25 DIAGNOSIS — M25872 Other specified joint disorders, left ankle and foot: Secondary | ICD-10-CM

## 2018-04-25 DIAGNOSIS — G8929 Other chronic pain: Secondary | ICD-10-CM | POA: Diagnosis not present

## 2018-04-25 DIAGNOSIS — M7672 Peroneal tendinitis, left leg: Secondary | ICD-10-CM | POA: Diagnosis not present

## 2018-04-25 NOTE — Progress Notes (Signed)
Anna Wade. , [place] [facility] [place]  Anna Wade - 53 y.o. female MRN [id number]  Date of birth: 1965-06-11  Visit Date: April 25, 2018  PCP: Darcus Austin, MD (Inactive)   Referred by: No ref. provider found  SUBJECTIVE:  Chief Complaint  Patient presents with  . Left Ankle - Follow-up    MRI L ankle 12/18/2017. Corticosteroid inj 01/29/2018. Following Nitro Protocol. Wearing Body Helix. HEP 3 x weekly.     HPI: Patient is here for follow-up of her left ankle pain.  She continues to have worsening pain with swelling and discomfort along the lateral aspect.  Is worse with motion as well as with rotation of the ankle.  She was using a compression sleeve until a week ago when she discontinued this due to being frustrated and notices a worsening amount of pain and swelling since that time.  She is status post 2 tendon sheath injections as well as a nitroglycerin protocol.  She has Body Helix Compression Sleeve as well as perform therapeutic exercises without significant relief.  REVIEW OF SYSTEMS: No significant nighttime awakenings due to this issue. Denies fevers, chills, recent weight gain or weight loss.  No night sweats.  Pt denies any change in bowel or bladder habits, muscle weakness, numbness or falls associated with this pain.  HISTORY:  Prior history reviewed and updated per electronic medical record.  Patient Active Problem List   Diagnosis Date Noted  . Chronic pain of left ankle 12/07/2017    MRI L ankle 12/18/2017 1. Tenosynovitis suspected about the peroneal tendons with overlying soft tissue subcutaneous edema and induration of Kager's fat pad. Intrasubstance split tear of the peroneal brevis with tendinosis of the peroneal longus tendons. 2. Minimal retrocalcaneal bursitis. 3. No acute osseous abnormality is noted. There is however faint marrow edema of the third through fifth metatarsals which may be  reactive.    Social History   Occupational History  . Not on file  Tobacco Use  . Smoking status: Former Smoker    Last attempt to quit: 04/29/2004    Years since quitting: 14.0  . Smokeless tobacco: Never Used  Substance and Sexual Activity  . Alcohol use: Not on file    Comment: 1x a month beer  . Drug use: No  . Sexual activity: Not on file   Social History   Social History Narrative  . Not on file    OBJECTIVE:  VS:  HT:5' 5" (165.1 cm)   WT:196 lb (88.9 kg)  BMI:32.62    BP:118/84  HR:78bpm  TEMP:98.1 F (36.7 C)(Oral)  RESP:95 %   PHYSICAL EXAM: Adult female. No acute distress.  Alert and appropriate. Left ankle has significant swelling within the posterior tendon sheath of the lateral ankle consistent with peroneal tendinitis/tendinopathy.  She has pain with resisted forefoot abduction and eversion of the foot.  Dorsiflexion plantarflexion strength is intact.  DP and PT pulses are 2+/4.   ASSESSMENT:  1. Chronic pain of left ankle   2. Peroneal tendinitis of left lower extremity   3. Ankle impingement syndrome, left     PROCEDURES:  None  PLAN:  Pertinent additional documentation may be included in corresponding procedure notes, imaging studies, problem based documentation and patient instructions.  No problem-specific Assessment & Plan notes found for this encounter.   Patient will be following up for PRP injection to the left ankle at her convenience.  This is an isolated,  self-contained kit with no impact on PPI usage.  She will require boot immobilization for 4 weeks following the injection with 2 weeks of strict immobilization.  Discussed minimizing the use of NSAIDs pre-and post.  Home Therapeutic Exercises: Continue previously prescribed home exercise program  TENDINOPATHY - Discussed that the anticipated amount of time for healing is 12- 24 weeks for Tendinopathic changes.  Emphasized the importance of improving blood flow as well as eccentric  loading of the tendon.    Activity modifications and the importance of avoiding exacerbating activities (limiting pain to no more than a 4 / 10 during or following activity) recommended and discussed.   Discussed red flag symptoms that warrant earlier emergent evaluation and patient voices understanding.    No orders of the defined types were placed in this encounter.  Lab Orders  No laboratory test(s) ordered today    Imaging Orders     US MSK POCT ULTRASOUND Referral Orders  No referral(s) requested today    Return if symptoms worsen or fail to improve, for possible PRP for L ankle.          Michael D Rigby, DO    [place] [facility] Physician      

## 2018-04-25 NOTE — Patient Instructions (Addendum)
We are going to be setting you up to perform a PRP (platelet rich plasma) injection. The cost of this is $495 out of pocket.  We will plan to have you come back at your convenience.  You will need to be off of anti-inflammatories for approximately 2 weeks prior to the injection and continue to avoid these medications (ibuprofen, Aleeve(naproxen), Meloxicam(mobic), Voltaren(diclofenac) during the initial 6 weeks following your injection. It is okay to take Tylenol if needed.  Please go ahead and fill your tramadol prescription so you have it available for after the procedure.  

## 2018-04-29 DIAGNOSIS — M5386 Other specified dorsopathies, lumbar region: Secondary | ICD-10-CM | POA: Diagnosis not present

## 2018-04-29 DIAGNOSIS — M9903 Segmental and somatic dysfunction of lumbar region: Secondary | ICD-10-CM | POA: Diagnosis not present

## 2018-04-29 DIAGNOSIS — M4716 Other spondylosis with myelopathy, lumbar region: Secondary | ICD-10-CM | POA: Diagnosis not present

## 2018-07-11 ENCOUNTER — Other Ambulatory Visit (HOSPITAL_COMMUNITY): Payer: Self-pay | Admitting: Orthopedic Surgery

## 2018-07-25 ENCOUNTER — Other Ambulatory Visit: Payer: Self-pay

## 2018-07-25 ENCOUNTER — Ambulatory Visit (INDEPENDENT_AMBULATORY_CARE_PROVIDER_SITE_OTHER): Payer: 59 | Admitting: Orthopedic Surgery

## 2018-07-25 ENCOUNTER — Encounter: Payer: Self-pay | Admitting: Orthopedic Surgery

## 2018-07-25 VITALS — Ht 65.0 in | Wt 196.0 lb

## 2018-07-25 DIAGNOSIS — M7672 Peroneal tendinitis, left leg: Secondary | ICD-10-CM | POA: Diagnosis not present

## 2018-07-25 NOTE — Progress Notes (Signed)
Office Visit Note   Patient: Anna Wade           Date of Birth: [birth year]           MRN: [id number] Visit Date: 07/25/2018              Requested by: No referring provider defined for this encounter. PCP: Shaune PollackGates, Donna, MD (Inactive)  Chief Complaint  Patient presents with  . Left Ankle - Pain      HPI: Patient is a 53 year old woman with perineal tendinitis with split tearing of the peroneal tendons left ankle posterior to the lateral malleolus.  Patient has been treated conservatively by Dr. Berline Choughigby for the past 3 months she has had steroid injections nitroglycerin patches fracture boot and has had MRI scan as well as wearing compression socks.  Patient states she has undergone laser therapy with her chiropractor without relief.  She states she was scheduled for surgery with Dr. Victorino DikeHewitt for peroneal tendon repair versus debridement valgus calcaneal osteotomy and a dorsiflexion osteotomy of the first metatarsal.  Patient is wondering if less invasive surgery is an option.  Assessment & Plan: Visit Diagnoses:  1. Peroneal tendinitis of lower leg, left     Plan: Discussed with her the ideal treatment to minimize risk of recurrence would be to proceed with the valgus osteotomy of the calcaneus dorsiflexion osteotomy of the first metatarsal and repair of the peroneal tendons.  Patient states she does not want to undergo this extensive of surgery.  Discussed that less extensive surgery could be to reinforce the peroneus brevis with the peroneus longus with a cut out in her orthotic to allow for plantarflexion of the first ray and pads laterally to place her hindfoot in valgus.  Patient states she would like to proceed with less invasive surgery in August we will set this up at her convenience.  Follow-Up Instructions: Return in about 1 week (around 08/01/2018).   Ortho Exam  Patient is alert, oriented, no adenopathy, well-dressed, normal affect, normal respiratory effort. Examination  patient has good pulses she has good dorsiflexion of the ankle good subtalar motion.  With her standing from behind she has varus alignment of the left calcaneus she has neutral alignment of the right calcaneus.  She has a stiff plantarflexed first ray on the left worse than the right.  She has swelling and tenderness to palpation of the peroneal tendons on the left posterior to the lateral malleolus.  Review of the MRI scan shows inflammation around the peroneal tendons with split tearing of the peroneus brevis with inflammation around the peroneus longus.  Patient has good inversion eversion plantar flexion dorsiflexion strength.  Imaging: No results found. No images are attached to the encounter.  Labs: No results found for: HGBA1C, ESRSEDRATE, CRP, LABURIC, REPTSTATUS, GRAMSTAIN, CULT, LABORGA   No results found for: ALBUMIN, PREALBUMIN, LABURIC  Body mass index is 32.62 kg/m.  Orders:  No orders of the defined types were placed in this encounter.  No orders of the defined types were placed in this encounter.    Procedures: No procedures performed  Clinical Data: No additional findings.  ROS:  All other systems negative, except as noted in the HPI. Review of Systems  Objective: Vital Signs: Ht 5\' 5"  (1.651 m)   Wt 196 lb (88.9 kg)   BMI 32.62 kg/m   Specialty Comments:  No specialty comments available.  PMFS History: Patient Active Problem List   Diagnosis Date Noted  . Chronic pain  of left ankle 12/07/2017   No past medical history on file.  No family history on file.  Past Surgical History:  Procedure Laterality Date  . CESAREAN SECTION     x2   Social History   Occupational History  . Not on file  Tobacco Use  . Smoking status: Former Smoker    Quit date: 04/29/2004    Years since quitting: 14.2  . Smokeless tobacco: Never Used  Substance and Sexual Activity  . Alcohol use: Not on file    Comment: 1x a month beer  . Drug use: No  . Sexual  activity: Not on file

## 2018-08-06 ENCOUNTER — Telehealth: Payer: Self-pay | Admitting: Orthopedic Surgery

## 2018-08-06 NOTE — Telephone Encounter (Signed)
Patient left a voicemail stating she is returning your call regarding upcoming surgery.

## 2018-08-07 ENCOUNTER — Other Ambulatory Visit: Payer: Self-pay

## 2018-08-14 ENCOUNTER — Other Ambulatory Visit: Payer: Self-pay | Admitting: Family

## 2018-08-16 ENCOUNTER — Encounter: Payer: Self-pay | Admitting: Orthopedic Surgery

## 2018-09-09 ENCOUNTER — Other Ambulatory Visit: Payer: Self-pay

## 2018-09-09 ENCOUNTER — Encounter (HOSPITAL_BASED_OUTPATIENT_CLINIC_OR_DEPARTMENT_OTHER): Payer: Self-pay

## 2018-09-13 ENCOUNTER — Other Ambulatory Visit (HOSPITAL_COMMUNITY)
Admission: RE | Admit: 2018-09-13 | Discharge: 2018-09-13 | Disposition: A | Discharge status: Home / Self Care | Payer: 59 | Source: Ambulatory Visit | Attending: Orthopedic Surgery | Admitting: Orthopedic Surgery

## 2018-09-13 DIAGNOSIS — Z01812 Encounter for preprocedural laboratory examination: Secondary | ICD-10-CM | POA: Insufficient documentation

## 2018-09-13 DIAGNOSIS — Z20828 Contact with and (suspected) exposure to other viral communicable diseases: Secondary | ICD-10-CM | POA: Insufficient documentation

## 2018-09-13 LAB — SARS CORONAVIRUS 2 (TAT 6-24 HRS): SARS Coronavirus 2: NEGATIVE

## 2018-09-16 NOTE — Progress Notes (Signed)

## 2018-09-16 NOTE — Anesthesia Preprocedure Evaluation (Addendum)
Anesthesia Evaluation  Patient identified by MRN, date of birth, ID band Patient awake    Reviewed: Allergy & Precautions, NPO status , Patient's Chart, lab work & pertinent test results  History of Anesthesia Complications (+) PONV and history of anesthetic complications  Airway Mallampati: II  TM Distance: >3 FB Neck ROM: Full    Dental no notable dental hx. (+) Teeth Intact   Pulmonary former smoker,    Pulmonary exam normal breath sounds clear to auscultation       Cardiovascular negative cardio ROS Normal cardiovascular exam Rhythm:Regular Rate:Normal     Neuro/Psych ADDnegative neurological ROS     GI/Hepatic negative GI ROS, Neg liver ROS,   Endo/Other  negative endocrine ROS  Renal/GU negative Renal ROS  negative genitourinary   Musculoskeletal Peroneal tendonitis left foot and ankle   Abdominal (+) + obese,   Peds  Hematology negative hematology ROS (+)   Anesthesia Other Findings   Reproductive/Obstetrics                            Anesthesia Physical Anesthesia Plan  ASA: II  Anesthesia Plan: General   Post-op Pain Management:  Regional for Post-op pain   Induction:   PONV Risk Score and Plan: 4 or greater and Ondansetron, Midazolam, Scopolamine patch - Pre-op and Dexamethasone  Airway Management Planned: LMA  Additional Equipment:   Intra-op Plan:   Post-operative Plan: Extubation in OR  Informed Consent: I have reviewed the patients History and Physical, chart, labs and discussed the procedure including the risks, benefits and alternatives for the proposed anesthesia with the patient or authorized representative who has indicated his/her understanding and acceptance.     Dental advisory given  Plan Discussed with: CRNA and Surgeon  Anesthesia Plan Comments:        Anesthesia Quick Evaluation

## 2018-09-17 ENCOUNTER — Ambulatory Visit (HOSPITAL_BASED_OUTPATIENT_CLINIC_OR_DEPARTMENT_OTHER)
Admission: RE | Admit: 2018-09-17 | Discharge: 2018-09-17 | Disposition: A | Discharge status: Home / Self Care | Payer: 59 | Attending: Orthopedic Surgery | Admitting: Orthopedic Surgery

## 2018-09-17 ENCOUNTER — Encounter (HOSPITAL_BASED_OUTPATIENT_CLINIC_OR_DEPARTMENT_OTHER): Payer: Self-pay

## 2018-09-17 ENCOUNTER — Encounter (HOSPITAL_BASED_OUTPATIENT_CLINIC_OR_DEPARTMENT_OTHER)
Admission: RE | Disposition: A | Discharge status: Home / Self Care | Payer: Self-pay | Source: Home / Self Care | Attending: Orthopedic Surgery

## 2018-09-17 ENCOUNTER — Ambulatory Visit (HOSPITAL_BASED_OUTPATIENT_CLINIC_OR_DEPARTMENT_OTHER): Payer: 59 | Admitting: Anesthesiology

## 2018-09-17 ENCOUNTER — Other Ambulatory Visit: Payer: Self-pay

## 2018-09-17 DIAGNOSIS — X58XXXA Exposure to other specified factors, initial encounter: Secondary | ICD-10-CM | POA: Diagnosis not present

## 2018-09-17 DIAGNOSIS — S86312A Strain of muscle(s) and tendon(s) of peroneal muscle group at lower leg level, left leg, initial encounter: Secondary | ICD-10-CM | POA: Diagnosis present

## 2018-09-17 DIAGNOSIS — G8929 Other chronic pain: Secondary | ICD-10-CM

## 2018-09-17 DIAGNOSIS — S86319A Strain of muscle(s) and tendon(s) of peroneal muscle group at lower leg level, unspecified leg, initial encounter: Secondary | ICD-10-CM

## 2018-09-17 DIAGNOSIS — Z87891 Personal history of nicotine dependence: Secondary | ICD-10-CM | POA: Diagnosis not present

## 2018-09-17 DIAGNOSIS — M25572 Pain in left ankle and joints of left foot: Secondary | ICD-10-CM

## 2018-09-17 DIAGNOSIS — M659 Synovitis and tenosynovitis, unspecified: Secondary | ICD-10-CM | POA: Diagnosis not present

## 2018-09-17 HISTORY — PX: TENDON REPAIR: SHX5111

## 2018-09-17 HISTORY — DX: Nausea with vomiting, unspecified: R11.2

## 2018-09-17 HISTORY — DX: Other specified postprocedural states: Z98.890

## 2018-09-17 SURGERY — TENDON REPAIR
Anesthesia: General | Site: Ankle | Laterality: Left

## 2018-09-17 MED ORDER — LIDOCAINE 2% (20 MG/ML) 5 ML SYRINGE
INTRAMUSCULAR | Status: AC
  Filled 2018-09-17: qty 5

## 2018-09-17 MED ORDER — SCOPOLAMINE 1 MG/3DAYS TD PT72
MEDICATED_PATCH | TRANSDERMAL | Status: AC
  Filled 2018-09-17: qty 1

## 2018-09-17 MED ORDER — FENTANYL CITRATE (PF) 100 MCG/2ML IJ SOLN: 25.0000 ug | INTRAMUSCULAR | Status: DC | PRN

## 2018-09-17 MED ORDER — CEFAZOLIN SODIUM-DEXTROSE 2-4 GM/100ML-% IV SOLN
INTRAVENOUS | Status: AC
  Filled 2018-09-17: qty 100

## 2018-09-17 MED ORDER — MIDAZOLAM HCL 2 MG/2ML IJ SOLN
INTRAMUSCULAR | Status: AC
  Filled 2018-09-17: qty 2

## 2018-09-17 MED ORDER — PROPOFOL 10 MG/ML IV BOLUS
INTRAVENOUS | Status: DC | PRN
  Administered 2018-09-17: 20 mg via INTRAVENOUS
  Administered 2018-09-17: 150 mg via INTRAVENOUS

## 2018-09-17 MED ORDER — PROPOFOL 500 MG/50ML IV EMUL
INTRAVENOUS | Status: AC
  Filled 2018-09-17: qty 50

## 2018-09-17 MED ORDER — CHLORHEXIDINE GLUCONATE 4 % EX LIQD: 60.0000 mL | Freq: Once | CUTANEOUS | Status: DC

## 2018-09-17 MED ORDER — FENTANYL CITRATE (PF) 100 MCG/2ML IJ SOLN
50.0000 ug | INTRAMUSCULAR | Status: DC | PRN
  Administered 2018-09-17: 100 ug via INTRAVENOUS

## 2018-09-17 MED ORDER — LACTATED RINGERS IV SOLN
INTRAVENOUS | Status: DC
  Administered 2018-09-17: 07:00:00 via INTRAVENOUS

## 2018-09-17 MED ORDER — ONDANSETRON HCL 4 MG/2ML IJ SOLN
INTRAMUSCULAR | Status: DC | PRN
  Administered 2018-09-17: 4 mg via INTRAVENOUS

## 2018-09-17 MED ORDER — HYDROCODONE-ACETAMINOPHEN 5-325 MG PO TABS: 1.0000 | ORAL_TABLET | ORAL | 0 refills | Status: DC | PRN

## 2018-09-17 MED ORDER — BUPIVACAINE-EPINEPHRINE (PF) 0.5% -1:200000 IJ SOLN
INTRAMUSCULAR | Status: DC | PRN
  Administered 2018-09-17: 30 mL via PERINEURAL

## 2018-09-17 MED ORDER — HYDROCODONE-ACETAMINOPHEN 7.5-325 MG PO TABS: 1.0000 | ORAL_TABLET | Freq: Once | ORAL | Status: DC | PRN

## 2018-09-17 MED ORDER — CEFAZOLIN SODIUM-DEXTROSE 2-4 GM/100ML-% IV SOLN
2.0000 g | INTRAVENOUS | Status: AC
  Administered 2018-09-17: 2 g via INTRAVENOUS

## 2018-09-17 MED ORDER — MIDAZOLAM HCL 2 MG/2ML IJ SOLN
1.0000 mg | INTRAMUSCULAR | Status: DC | PRN
  Administered 2018-09-17: 2 mg via INTRAVENOUS

## 2018-09-17 MED ORDER — FENTANYL CITRATE (PF) 100 MCG/2ML IJ SOLN
INTRAMUSCULAR | Status: AC
  Filled 2018-09-17: qty 2

## 2018-09-17 MED ORDER — DEXAMETHASONE SODIUM PHOSPHATE 4 MG/ML IJ SOLN
INTRAMUSCULAR | Status: DC | PRN
  Administered 2018-09-17: 10 mg via INTRAVENOUS

## 2018-09-17 MED ORDER — BUPIVACAINE HCL (PF) 0.5 % IJ SOLN
INTRAMUSCULAR | Status: AC
  Filled 2018-09-17: qty 30

## 2018-09-17 MED ORDER — SCOPOLAMINE 1 MG/3DAYS TD PT72
1.0000 | MEDICATED_PATCH | Freq: Once | TRANSDERMAL | Status: DC
  Administered 2018-09-17: 07:00:00 1.5 mg via TRANSDERMAL

## 2018-09-17 MED ORDER — ONDANSETRON HCL 4 MG/2ML IJ SOLN
INTRAMUSCULAR | Status: AC
  Filled 2018-09-17: qty 2

## 2018-09-17 MED ORDER — DEXAMETHASONE SODIUM PHOSPHATE 10 MG/ML IJ SOLN
INTRAMUSCULAR | Status: AC
  Filled 2018-09-17: qty 1

## 2018-09-17 MED ORDER — LIDOCAINE HCL (CARDIAC) PF 100 MG/5ML IV SOSY
PREFILLED_SYRINGE | INTRAVENOUS | Status: DC | PRN
  Administered 2018-09-17: 80 mg via INTRAVENOUS

## 2018-09-17 MED ORDER — MEPERIDINE HCL 25 MG/ML IJ SOLN: 6.2500 mg | INTRAMUSCULAR | Status: DC | PRN

## 2018-09-17 MED ORDER — METOCLOPRAMIDE HCL 5 MG/ML IJ SOLN: 10.0000 mg | Freq: Once | INTRAMUSCULAR | Status: DC | PRN

## 2018-09-17 SURGICAL SUPPLY — 59 items
BLADE SURG 10 STRL SS (BLADE) IMPLANT
BLADE SURG 15 STRL LF DISP TIS (BLADE) ×1 IMPLANT
BLADE SURG 15 STRL SS (BLADE) ×3
BNDG CMPR 9X4 STRL LF SNTH (GAUZE/BANDAGES/DRESSINGS) ×1
BNDG COHESIVE 4X5 TAN STRL (GAUZE/BANDAGES/DRESSINGS) ×3 IMPLANT
BNDG ESMARK 4X9 LF (GAUZE/BANDAGES/DRESSINGS) ×3 IMPLANT
BNDG GAUZE ELAST 4 BULKY (GAUZE/BANDAGES/DRESSINGS) ×2 IMPLANT
BOOT STEPPER DURA MED (SOFTGOODS) ×2 IMPLANT
COVER BACK TABLE REUSABLE LG (DRAPES) ×3 IMPLANT
COVER WAND RF STERILE (DRAPES) IMPLANT
DECANTER SPIKE VIAL GLASS SM (MISCELLANEOUS) IMPLANT
DRAPE EXTREMITY T 121X128X90 (DISPOSABLE) ×3 IMPLANT
DRAPE U-SHAPE 47X51 STRL (DRAPES) ×3 IMPLANT
DRSG EMULSION OIL 3X3 NADH (GAUZE/BANDAGES/DRESSINGS) ×3 IMPLANT
DRSG PAD ABDOMINAL 8X10 ST (GAUZE/BANDAGES/DRESSINGS) ×2 IMPLANT
DURAPREP 26ML APPLICATOR (WOUND CARE) ×3 IMPLANT
ELECT REM PT RETURN 9FT ADLT (ELECTROSURGICAL) ×3
ELECTRODE REM PT RTRN 9FT ADLT (ELECTROSURGICAL) ×1 IMPLANT
GAUZE 4X4 16PLY RFD (DISPOSABLE) IMPLANT
GAUZE SPONGE 4X4 12PLY STRL (GAUZE/BANDAGES/DRESSINGS) ×3 IMPLANT
GLOVE BIOGEL PI IND STRL 6.5 (GLOVE) IMPLANT
GLOVE BIOGEL PI IND STRL 8.5 (GLOVE) IMPLANT
GLOVE BIOGEL PI IND STRL 9 (GLOVE) ×1 IMPLANT
GLOVE BIOGEL PI INDICATOR 6.5 (GLOVE) ×2
GLOVE BIOGEL PI INDICATOR 8.5 (GLOVE) ×2
GLOVE BIOGEL PI INDICATOR 9 (GLOVE)
GLOVE ECLIPSE 6.5 STRL STRAW (GLOVE) ×2 IMPLANT
GLOVE SURG ORTHO 9.0 STRL STRW (GLOVE) ×3 IMPLANT
GOWN STRL REUS W/ TWL LRG LVL3 (GOWN DISPOSABLE) ×1 IMPLANT
GOWN STRL REUS W/ TWL XL LVL3 (GOWN DISPOSABLE) ×1 IMPLANT
GOWN STRL REUS W/TWL LRG LVL3 (GOWN DISPOSABLE) ×6
GOWN STRL REUS W/TWL XL LVL3 (GOWN DISPOSABLE) ×3
NDL HYPO 25X1 1.5 SAFETY (NEEDLE) IMPLANT
NDL SAFETY ECLIPSE 18X1.5 (NEEDLE) IMPLANT
NEEDLE HYPO 18GX1.5 SHARP (NEEDLE)
NEEDLE HYPO 25X1 1.5 SAFETY (NEEDLE) IMPLANT
NS IRRIG 1000ML POUR BTL (IV SOLUTION) ×3 IMPLANT
PACK BASIN DAY SURGERY FS (CUSTOM PROCEDURE TRAY) ×3 IMPLANT
PAD CAST 4YDX4 CTTN HI CHSV (CAST SUPPLIES) ×1 IMPLANT
PADDING CAST ABS 4INX4YD NS (CAST SUPPLIES) ×2
PADDING CAST ABS COTTON 4X4 ST (CAST SUPPLIES) ×1 IMPLANT
PADDING CAST COTTON 4X4 STRL (CAST SUPPLIES) ×3
PENCIL BUTTON HOLSTER BLD 10FT (ELECTRODE) ×3 IMPLANT
SLEEVE SCD COMPRESS KNEE MED (MISCELLANEOUS) ×1 IMPLANT
SPONGE LAP 18X18 RF (DISPOSABLE) ×3 IMPLANT
STOCKINETTE 6  STRL (DRAPES) ×2
STOCKINETTE 6 STRL (DRAPES) ×1 IMPLANT
SUT ETHILON 2 0 FSLX (SUTURE) ×2 IMPLANT
SUT ETHILON 3 0 FSLX (SUTURE) ×3 IMPLANT
SUT ETHILON 5 0 PS 2 18 (SUTURE) IMPLANT
SUT FIBERWIRE 2-0 18 17.9 3/8 (SUTURE) ×9
SUT VIC AB 2-0 CT1 27 (SUTURE)
SUT VIC AB 2-0 CT1 TAPERPNT 27 (SUTURE) IMPLANT
SUT VICRYL 4-0 PS2 18IN ABS (SUTURE) IMPLANT
SUTURE FIBERWR 2-0 18 17.9 3/8 (SUTURE) IMPLANT
SYR BULB 3OZ (MISCELLANEOUS) ×3 IMPLANT
SYR CONTROL 10ML LL (SYRINGE) IMPLANT
TOWEL GREEN STERILE FF (TOWEL DISPOSABLE) ×6 IMPLANT
UNDERPAD 30X30 (UNDERPADS AND DIAPERS) ×1 IMPLANT

## 2018-09-17 NOTE — Anesthesia Postprocedure Evaluation (Signed)
Anesthesia Post Note  Patient: Anna Wade  Procedure(s) Performed: Reinforce Peroneal Brevis with Peroneal Longus Left (Left Ankle)     Patient location during evaluation: PACU Anesthesia Type: General Level of consciousness: awake and alert and oriented Pain management: pain level controlled Vital Signs Assessment: post-procedure vital signs reviewed and stable Respiratory status: spontaneous breathing, nonlabored ventilation and respiratory function stable Cardiovascular status: blood pressure returned to baseline and stable Postop Assessment: no apparent nausea or vomiting Anesthetic complications: no    Last Vitals:  Vitals:   09/17/18 0845 09/17/18 0900  BP: 110/84 116/83  Pulse: 68 73  Resp: 14 15  Temp:    SpO2: 99% 98%    Last Pain:  Vitals:   09/17/18 0900  TempSrc:   PainSc: 0-No pain                 Shahrzad Koble A.

## 2018-09-17 NOTE — Discharge Instructions (Signed)
  Post Anesthesia Home Care Instructions  Activity: Get plenty of rest for the remainder of the day. A responsible individual must stay with you for 24 hours following the procedure.  For the next 24 hours, DO NOT: -Drive a car -Operate machinery -Drink alcoholic beverages -Take any medication unless instructed by your physician -Make any legal decisions or sign important papers.  Meals: Start with liquid foods such as gelatin or soup. Progress to regular foods as tolerated. Avoid greasy, spicy, heavy foods. If nausea and/or vomiting occur, drink only clear liquids until the nausea and/or vomiting subsides. Call your physician if vomiting continues.  Special Instructions/Symptoms: Your throat may feel dry or sore from the anesthesia or the breathing tube placed in your throat during surgery. If this causes discomfort, gargle with warm salt water. The discomfort should disappear within 24 hours.  If you had a scopolamine patch placed behind your ear for the management of post- operative nausea and/or vomiting:  1. The medication in the patch is effective for 72 hours, after which it should be removed.  Wrap patch in a tissue and discard in the trash. Wash hands thoroughly with soap and water. 2. You may remove the patch earlier than 72 hours if you experience unpleasant side effects which may include dry mouth, dizziness or visual disturbances. 3. Avoid touching the patch. Wash your hands with soap and water after contact with the patch.       Regional Anesthesia Blocks  1. Numbness or the inability to move the "blocked" extremity may last from 3-48 hours after placement. The length of time depends on the medication injected and your individual response to the medication. If the numbness is not going away after 48 hours, call your surgeon.  2. The extremity that is blocked will need to be protected until the numbness is gone and the  Strength has returned. Because you cannot feel it, you  will need to take extra care to avoid injury. Because it may be weak, you may have difficulty moving it or using it. You may not know what position it is in without looking at it while the block is in effect.  3. For blocks in the legs and feet, returning to weight bearing and walking needs to be done carefully. You will need to wait until the numbness is entirely gone and the strength has returned. You should be able to move your leg and foot normally before you try and bear weight or walk. You will need someone to be with you when you first try to ensure you do not fall and possibly risk injury.  4. Bruising and tenderness at the needle site are common side effects and will resolve in a few days.  5. Persistent numbness or new problems with movement should be communicated to the surgeon or the [facility] ([phone])/ [facility] ([phone]).    Call your surgeon if you experience:   1.  Fever over 101.0. 2.  Inability to urinate. 3.  Nausea and/or vomiting. 4.  Extreme swelling or bruising at the surgical site. 5.  Continued bleeding from the incision. 6.  Increased pain, redness or drainage from the incision. 7.  Problems related to your pain medication. 8.  Any problems and/or concerns 

## 2018-09-17 NOTE — H&P (Signed)
Anna Wade is an 53 y.o. female.   Chief Complaint: Rupture peroneus brevis tendon HPI: Patient is a 53 year old woman with peroneus brevis insufficiency.  Patient is undergone prolonged conservative therapy and presents at this time for reinforcement of the peroneus brevis with the peroneus longus.  Past Medical History:  Diagnosis Date  . PONV (postoperative nausea and vomiting)     Past Surgical History:  Procedure Laterality Date  . CESAREAN SECTION     x2  . ENDOMETRIAL ABLATION  2006    History reviewed. No pertinent family history. Social History:  reports that she quit smoking about 14 years ago. She has never used smokeless tobacco. She reports that she does not use drugs. No history on file for alcohol.  Allergies: No Known Allergies  Medications Prior to Admission  Medication Sig Dispense Refill  . amphetamine-dextroamphetamine (ADDERALL) 20 MG tablet Take 20 mg by mouth 2 (two) times daily.    . Multiple Vitamin (MULTIVITAMIN) tablet Take 1 tablet by mouth daily.    . Omega-3 Fatty Acids (FISH OIL) 1000 MG CAPS Take by mouth 2 (two) times daily.    . nitroGLYCERIN (NITRODUR - DOSED IN MG/24 HR) 0.2 mg/hr patch Place 1/4 to 1/2 of a patch over affected region. Remove and replace once daily.  Slightly alter skin placement daily 30 patch 1    No results found for this or any previous visit (from the past 48 hour(s)). No results found.  Review of Systems  All other systems reviewed and are negative.   Blood pressure 120/84, pulse 67, temperature 98.3 F (36.8 C), temperature source Oral, resp. rate 20, height 5\' 5"  (1.651 m), weight 89 kg, SpO2 100 %. Physical Exam  Examination patient is alert oriented no adenopathy well-dressed normal affect normal respiratory effort she has good dorsalis pedis pulse.  She does have a slight cavovarus foot with a plantarflexed first ray placing her hindfoot in slight varus.  MRI scan shows peroneus brevis insufficiency.  She has  tenderness to palpation of the peroneal tendons and has swelling.  Patient has pain with eversion. Assessment/Plan Assessment: Peroneus brevis insufficiency with pain with activities of daily living.  Plan: We will plan for reinforcement of the peroneus brevis with the peroneus longus.  Risks and benefits were discussed including infection neurovascular injury persistent pain need for additional surgery.  Patient states she understands and wished to proceed at this time.  Newt Minion, MD 09/17/2018, 6:50 AM

## 2018-09-17 NOTE — Anesthesia Procedure Notes (Signed)
Procedure Name: LMA Insertion Date/Time: 09/17/2018 7:32 AM Performed by: Lyndee Leo, CRNA Pre-anesthesia Checklist: Patient identified, Emergency Drugs available, Suction available and Patient being monitored Patient Re-evaluated:Patient Re-evaluated prior to induction Oxygen Delivery Method: Circle system utilized Preoxygenation: Pre-oxygenation with 100% oxygen Induction Type: IV induction Ventilation: Mask ventilation without difficulty LMA: LMA inserted LMA Size: 4.0 Number of attempts: 1 Airway Equipment and Method: Bite block Placement Confirmation: positive ETCO2 Tube secured with: Tape Dental Injury: Teeth and Oropharynx as per pre-operative assessment

## 2018-09-17 NOTE — Op Note (Signed)
09/17/2018  8:13 AM  PATIENT:  Anna Wade    PRE-OPERATIVE DIAGNOSIS:  Peroneal Tendonitis Left Ankle/Foot  POST-OPERATIVE DIAGNOSIS: Extensive tearing of the peroneus brevis left ankle  PROCEDURE:  Reinforce Peroneal Brevis with Peroneal Longus Left, with debridement of the peroneus brevis muscle that extended into the canal  SURGEON:  Newt Minion, MD  PHYSICIAN ASSISTANT:None ANESTHESIA:   General  PREOPERATIVE INDICATIONS:  Anna Wade is a  53 y.o. female with a diagnosis of Peroneal Tendonitis Left Ankle/Foot who failed conservative measures and elected for surgical management.    The risks benefits and alternatives were discussed with the patient preoperatively including but not limited to the risks of infection, bleeding, nerve injury, cardiopulmonary complications, the need for revision surgery, among others, and the patient was willing to proceed.  OPERATIVE IMPLANTS: none  @ENCIMAGES @  OPERATIVE FINDINGS: Extensive tearing of the peroneus brevis approximately 75% of the tendon with extension of the peroneus brevis muscle into the canal with extensive synovitis.  OPERATIVE PROCEDURE: Patient was brought to the operating room after undergoing a popliteal block.  After adequate levels anesthesia were obtained patient's left lower extremity was prepped using DuraPrep draped into a sterile field a timeout was called.  A lateral incision was made over the peroneal tendons.  The peroneal retinaculum was incised and reflected.  Visualization showed extensive synovitis this was resected.  Visualization of the peroneus brevis showed approximately 75% of the tendon had extensive degenerative tearing.  The muscle also extended into the canal.  The peroneus brevis muscle as well as the degenerative tendon was sharply excised this left approximately 25% of the peroneus brevis.  There was still good attachment.  The peroneus longus was then used to reinforce the peroneus brevis.  This  was sutured with 2-0 FiberWire.  The tendons had a good resting length.  The wound was irrigated normal saline electrocautery was used for hemostasis.  The retinaculum was closed using 2-0 FiberWire.  The incision was closed using 2-0 nylon.  A sterile dressing was applied patient was extubated taken the PACU in stable condition.   DISCHARGE PLANNING:  Antibiotic duration: Preoperative antibiotics with Kefzol  Weightbearing: Nonweightbearing for 2 weeks  Pain medication: Prescription for Vicodin  Dressing care/ Wound VAC: Follow-up in the office in 1 week to change the dressing  Ambulatory devices: Kneeling scooter or crutches  Discharge to: Home.  Follow-up: In the office 1 week post operative.

## 2018-09-17 NOTE — Progress Notes (Signed)
Assisted Dr. Foster with left, ultrasound guided, popliteal block. Side rails up, monitors on throughout procedure. See vital signs in flow sheet. Tolerated Procedure well. 

## 2018-09-17 NOTE — Anesthesia Procedure Notes (Signed)
Anesthesia Regional Block: Popliteal block   Pre-Anesthetic Checklist: ,, timeout performed, Correct Patient, Correct Site, Correct Laterality, Correct Procedure, Correct Position, site marked, Risks and benefits discussed,  Surgical consent,  Pre-op evaluation,  At surgeon's request and post-op pain management  Laterality: Left  Prep: chloraprep       Needles:  Injection technique: Single-shot  Needle Type: Echogenic Stimulator Needle     Needle Length: 9cm  Needle Gauge: 21   Needle insertion depth: 7 cm   Additional Needles:   Procedures:,,,, ultrasound used (permanent image in chart),,,,  Narrative:  Start time: 09/17/2018 7:10 AM End time: 09/17/2018 7:15 AM Injection made incrementally with aspirations every 5 mL.  Performed by: Personally  Anesthesiologist: Josephine Igo, MD  Additional Notes: Timeout performed. Patient sedated. Relevant anatomy ID'd using [place]. Incremental 2-67ml injection of LA with frequent aspiration. Patient tolerated procedure well.        Left Popliteal Block

## 2018-09-17 NOTE — Transfer of Care (Signed)
Immediate Anesthesia Transfer of Care Note  Patient: Anna Wade  Procedure(s) Performed: Reinforce Peroneal Brevis with Peroneal Longus Left (Left Ankle)  Patient Location: PACU  Anesthesia Type:GA combined with regional for post-op pain  Level of Consciousness: sedated and responds to stimulation  Airway & Oxygen Therapy: Patient Spontanous Breathing and Patient connected to nasal cannula oxygen  Post-op Assessment: Report given to RN and Post -op Vital signs reviewed and stable  Post vital signs: Reviewed and stable  Last Vitals:  Vitals Value Taken Time  BP 109/71 09/17/18 0819  Temp    Pulse 65 09/17/18 0819  Resp 15 09/17/18 0819  SpO2 100 % 09/17/18 0819  Vitals shown include unvalidated device data.  Last Pain:  Vitals:   09/17/18 0635  TempSrc: Oral  PainSc: 0-No pain      Patients Stated Pain Goal: 5 (62/22/97 9892)  Complications: No apparent anesthesia complications

## 2018-09-18 ENCOUNTER — Encounter (HOSPITAL_BASED_OUTPATIENT_CLINIC_OR_DEPARTMENT_OTHER): Payer: Self-pay | Admitting: Orthopedic Surgery

## 2018-09-19 ENCOUNTER — Encounter (HOSPITAL_BASED_OUTPATIENT_CLINIC_OR_DEPARTMENT_OTHER): Payer: Self-pay

## 2018-09-19 ENCOUNTER — Ambulatory Visit (HOSPITAL_BASED_OUTPATIENT_CLINIC_OR_DEPARTMENT_OTHER): Admit: 2018-09-19 | Payer: 59 | Admitting: Orthopedic Surgery

## 2018-09-19 SURGERY — OSTEOTOMY, CALCANEUS
Anesthesia: Choice | Laterality: Left

## 2018-09-24 ENCOUNTER — Ambulatory Visit (INDEPENDENT_AMBULATORY_CARE_PROVIDER_SITE_OTHER): Payer: 59 | Admitting: Orthopedic Surgery

## 2018-09-24 ENCOUNTER — Inpatient Hospital Stay: Payer: 59 | Admitting: Family

## 2018-09-24 ENCOUNTER — Encounter: Payer: Self-pay | Admitting: Orthopedic Surgery

## 2018-09-24 VITALS — Ht 65.0 in | Wt 196.0 lb

## 2018-09-24 DIAGNOSIS — S86312D Strain of muscle(s) and tendon(s) of peroneal muscle group at lower leg level, left leg, subsequent encounter: Secondary | ICD-10-CM

## 2018-09-24 NOTE — Progress Notes (Signed)
   Post-Op Visit Note   Patient: Anna Wade           Date of Birth: 05/16/1965           MRN: [id number] Visit Date: 09/24/2018 PCP: Darcus Austin, MD (Inactive)  Chief Complaint:  Chief Complaint  Patient presents with  . Left Foot - Routine Post Op    09/17/18 left foot reinforce peroneal brevis with peroneal longus    HPI:  HPI Patient is a 53 year old woman seen today status post reinforcing of the left peroneal brevis with the peroneal longus.  On a kneeling scooter wearing a cam walker.  Ortho Exam Her incision is clean dry intact well approximated there is no breakdown of the surrounding skin.  Very minimal swelling of her ankle and foot.  Visit Diagnoses:  1. Peroneal tendon rupture, left, subsequent encounter     Plan: She will continue her cam walker.  Begin daily dose of cleansing dry dressing changes.  Elevate.  Nonweightbearing.  She will follow-up in 1 week for suture removal.  Would like to discuss returning to work for serial light duty at that time.  Follow-Up Instructions: Return in about 1 week (around 10/01/2018).   Imaging: No results found.  Orders:  No orders of the defined types were placed in this encounter.  No orders of the defined types were placed in this encounter.    PMFS History: Patient Active Problem List   Diagnosis Date Noted  . Peroneal tendon rupture   . Chronic pain of left ankle 12/07/2017   Past Medical History:  Diagnosis Date  . PONV (postoperative nausea and vomiting)     History reviewed. No pertinent family history.  Past Surgical History:  Procedure Laterality Date  . CESAREAN SECTION     x2  . ENDOMETRIAL ABLATION  2006  . TENDON REPAIR Left 09/17/2018   Procedure: Reinforce Peroneal Brevis with Peroneal Longus Left;  Surgeon: Newt Minion, MD;  Location: [facility];  Service: Orthopedics;  Laterality: Left;   Social History   Occupational History  . Not on file  Tobacco Use  . Smoking  status: Former Smoker    Quit date: 04/29/2004    Years since quitting: 14.4  . Smokeless tobacco: Never Used  Substance and Sexual Activity  . Alcohol use: Not on file    Comment: 1x a month beer  . Drug use: No  . Sexual activity: Not on file

## 2018-10-01 ENCOUNTER — Encounter: Payer: Self-pay | Admitting: Orthopedic Surgery

## 2018-10-01 ENCOUNTER — Ambulatory Visit (INDEPENDENT_AMBULATORY_CARE_PROVIDER_SITE_OTHER): Payer: 59 | Admitting: Orthopedic Surgery

## 2018-10-01 VITALS — Ht 65.0 in | Wt 196.0 lb

## 2018-10-01 DIAGNOSIS — S86312D Strain of muscle(s) and tendon(s) of peroneal muscle group at lower leg level, left leg, subsequent encounter: Secondary | ICD-10-CM

## 2018-10-01 NOTE — Progress Notes (Signed)
   Office Visit Note   Patient: Anna Wade           Date of Birth: 1965/12/05           MRN: [id number] Visit Date: 10/01/2018              Requested by: No referring provider defined for this encounter. PCP: Darcus Austin, MD (Inactive)  Chief Complaint  Patient presents with  . Left Ankle - Routine Post Op    09/17/2018 reinforce peroneal brevis w/peroneal longus  . Left Foot - Routine Post Op      HPI: Patient is a 53 year old woman who presents 2 weeks status post reinforcement of the peroneus brevis with the peroneus longus.  Patient has been nonweightbearing in her fracture boot on a kneeling scooter.  She states her foot feels fine.  She states she does have some swelling.  Assessment & Plan: Visit Diagnoses: No diagnosis found.  Plan: Patient will begin weightbearing as tolerated in her fracture boot.  She will work on range of motion of the ankle and subtalar joint.  Scar massage for a year.  Follow-Up Instructions: No follow-ups on file.   Ortho Exam  Patient is alert, oriented, no adenopathy, well-dressed, normal affect, normal respiratory effort. Examination the incision is well-healed sutures are removed.  She has good range of motion of the ankle and subtalar joint there is swelling laterally.  No pain with range of motion of the ankle.  Imaging: No results found. No images are attached to the encounter.  Labs: No results found for: HGBA1C, ESRSEDRATE, CRP, LABURIC, REPTSTATUS, GRAMSTAIN, CULT, LABORGA   No results found for: ALBUMIN, PREALBUMIN, LABURIC  No results found for: MG No results found for: VD25OH  No results found for: PREALBUMIN No flowsheet data found.   Body mass index is 32.62 kg/m.  Orders:  No orders of the defined types were placed in this encounter.  No orders of the defined types were placed in this encounter.    Procedures: No procedures performed  Clinical Data: No additional findings.  ROS:  All other systems  negative, except as noted in the HPI. Review of Systems  Objective: Vital Signs: Ht 5\' 5"  (1.651 m)   Wt 196 lb (88.9 kg)   BMI 32.62 kg/m   Specialty Comments:  No specialty comments available.  PMFS History: Patient Active Problem List   Diagnosis Date Noted  . Peroneal tendon rupture   . Chronic pain of left ankle 12/07/2017   Past Medical History:  Diagnosis Date  . PONV (postoperative nausea and vomiting)     No family history on file.  Past Surgical History:  Procedure Laterality Date  . CESAREAN SECTION     x2  . ENDOMETRIAL ABLATION  2006  . TENDON REPAIR Left 09/17/2018   Procedure: Reinforce Peroneal Brevis with Peroneal Longus Left;  Surgeon: Newt Minion, MD;  Location: [facility];  Service: Orthopedics;  Laterality: Left;   Social History   Occupational History  . Not on file  Tobacco Use  . Smoking status: Former Smoker    Quit date: 04/29/2004    Years since quitting: 14.4  . Smokeless tobacco: Never Used  Substance and Sexual Activity  . Alcohol use: Not on file    Comment: 1x a month beer  . Drug use: No  . Sexual activity: Not on file

## 2018-10-08 ENCOUNTER — Telehealth: Payer: Self-pay | Admitting: Orthopedic Surgery

## 2018-10-08 ENCOUNTER — Other Ambulatory Visit: Payer: Self-pay

## 2018-10-08 NOTE — Telephone Encounter (Signed)
I spoke with pt and letter written for pt to return to work on 10/14/18 working 4 hours a day for 2 weeks activities as tolerated by pt in fx boot. Then may return to regular full time duty after that. Pt asked for this to be faxed to [phone]

## 2018-10-08 NOTE — Telephone Encounter (Signed)
Patient left a voicemail message wanting to talk with you about getting a RTW note for either full duty or either with restrictions.  CB#[phone].  Thank you.

## 2018-10-22 ENCOUNTER — Encounter: Payer: Self-pay | Admitting: Orthopedic Surgery

## 2018-10-22 ENCOUNTER — Ambulatory Visit (INDEPENDENT_AMBULATORY_CARE_PROVIDER_SITE_OTHER): Payer: 59 | Admitting: Orthopedic Surgery

## 2018-10-22 ENCOUNTER — Other Ambulatory Visit: Payer: Self-pay

## 2018-10-22 VITALS — Ht 65.0 in | Wt 196.0 lb

## 2018-10-22 DIAGNOSIS — S86302S Unspecified injury of muscle(s) and tendon(s) of peroneal muscle group at lower leg level, left leg, sequela: Secondary | ICD-10-CM

## 2018-10-22 NOTE — Progress Notes (Signed)
   Office Visit Note   Patient: Anna Wade           Date of Birth: Dec 02, 1965           MRN: [id number] Visit Date: 10/22/2018              Requested by: No referring provider defined for this encounter. PCP: Darcus Austin, MD (Inactive)  Chief Complaint  Patient presents with  . Left Ankle - Follow-up      HPI: Patient is a 53 year old woman who presents in follow-up status post left ankle peroneal tendon reconstruction.  Patient states she feels like there is a little bump at the top of the incision that she noticed over the weekend she is currently not taking any pain medication.  She states she still has some swelling.  She is currently ambulating in a cam boot.  Assessment & Plan: Visit Diagnoses:  1. Peroneal tendon injury, left, sequela     Plan: A prescription was written for physical therapy to begin range of motion exercises proprioception and peroneal tendon strengthening.  Recommended compression stockings for the swelling.  Follow-Up Instructions: Return in about 4 weeks (around 11/19/2018).   Ortho Exam  Patient is alert, oriented, no adenopathy, well-dressed, normal affect, normal respiratory effort. Examination the incision is well-healed there does appear to be a prominence proximally this may be a suture knot.  She does have decreased range of motion but function of the peroneal tendon.  There is venous swelling.  Imaging: No results found. No images are attached to the encounter.  Labs: No results found for: HGBA1C, ESRSEDRATE, CRP, LABURIC, REPTSTATUS, GRAMSTAIN, CULT, LABORGA   No results found for: ALBUMIN, PREALBUMIN, LABURIC  No results found for: MG No results found for: VD25OH  No results found for: PREALBUMIN No flowsheet data found.   Body mass index is 32.62 kg/m.  Orders:  Orders Placed This Encounter  Procedures  . Ambulatory referral to Physical Therapy   No orders of the defined types were placed in this encounter.    Procedures: No procedures performed  Clinical Data: No additional findings.  ROS:  All other systems negative, except as noted in the HPI. Review of Systems  Objective: Vital Signs: Ht 5\' 5"  (1.651 m)   Wt 196 lb (88.9 kg)   BMI 32.62 kg/m   Specialty Comments:  No specialty comments available.  PMFS History: Patient Active Problem List   Diagnosis Date Noted  . Peroneal tendon rupture   . Chronic pain of left ankle 12/07/2017   Past Medical History:  Diagnosis Date  . PONV (postoperative nausea and vomiting)     History reviewed. No pertinent family history.  Past Surgical History:  Procedure Laterality Date  . CESAREAN SECTION     x2  . ENDOMETRIAL ABLATION  2006  . TENDON REPAIR Left 09/17/2018   Procedure: Reinforce Peroneal Brevis with Peroneal Longus Left;  Surgeon: Newt Minion, MD;  Location: [facility];  Service: Orthopedics;  Laterality: Left;   Social History   Occupational History  . Not on file  Tobacco Use  . Smoking status: Former Smoker    Quit date: 04/29/2004    Years since quitting: 14.4  . Smokeless tobacco: Never Used  Substance and Sexual Activity  . Alcohol use: Not on file    Comment: 1x a month beer  . Drug use: No  . Sexual activity: Not on file

## 2018-10-30 ENCOUNTER — Ambulatory Visit: Payer: 59 | Admitting: Physical Therapy

## 2018-10-31 ENCOUNTER — Ambulatory Visit: Payer: 59 | Attending: Orthopedic Surgery

## 2018-10-31 ENCOUNTER — Other Ambulatory Visit: Payer: Self-pay

## 2018-10-31 ENCOUNTER — Ambulatory Visit: Payer: 59

## 2018-10-31 DIAGNOSIS — M25572 Pain in left ankle and joints of left foot: Secondary | ICD-10-CM | POA: Insufficient documentation

## 2018-10-31 DIAGNOSIS — R2689 Other abnormalities of gait and mobility: Secondary | ICD-10-CM | POA: Diagnosis present

## 2018-10-31 DIAGNOSIS — S86312D Strain of muscle(s) and tendon(s) of peroneal muscle group at lower leg level, left leg, subsequent encounter: Secondary | ICD-10-CM | POA: Diagnosis not present

## 2018-10-31 DIAGNOSIS — G8929 Other chronic pain: Secondary | ICD-10-CM | POA: Diagnosis present

## 2018-10-31 NOTE — Therapy (Signed)
[facility] [address] [place], [place], [address] Phone: [phone]   Fax:  [phone]  Physical Therapy Evaluation  Patient Details  Name: Anna Wade MRN: [id number] Date of Birth: 12/07/1965 Referring Provider (PT): Aldean Baker MD   Encounter Date: 10/31/2018  PT End of Session - 10/31/18 1656    Visit Number  1    Number of Visits  9    Date for PT Re-Evaluation  12/06/18    PT Start Time  1515    PT Stop Time  1600    PT Time Calculation (min)  45 min    Activity Tolerance  Patient tolerated treatment well    Behavior During Therapy  [facility] for tasks assessed/performed       Past Medical History:  Diagnosis Date  . PONV (postoperative nausea and vomiting)     Past Surgical History:  Procedure Laterality Date  . CESAREAN SECTION     x2  . ENDOMETRIAL ABLATION  2006  . TENDON REPAIR Left 09/17/2018   Procedure: Reinforce Peroneal Brevis with Peroneal Longus Left;  Surgeon: Nadara Mustard, MD;  Location: [facility];  Service: Orthopedics;  Laterality: Left;    There were no vitals filed for this visit.   Subjective Assessment - 10/31/18 1528    Subjective  Pt reports that she had surgery on 09/17/18. She was NWB for about 2 weeks and was using a knee roller that she had purchased. She states that she tried crutches but they were too difficult. She was then put on WBAT and is now full WB. She states that her pain gets worse with increased activity.    Limitations  Walking;Standing    How long can you sit comfortably?  no change    How long can you stand comfortably?  20 minutes    How long can you walk comfortably?  20 minutes    Patient Stated Goals  I would like to get back to walking for exercise and decrease the pain.    Currently in Pain?  Yes    Pain Score  2     Pain Location  Ankle    Pain Orientation  Left    Pain Descriptors / Indicators  Tightness;Sore    Pain Type  Chronic pain     Pain Onset  1 to 4 weeks ago    Pain Frequency  Constant    Aggravating Factors   standing/walking    Pain Relieving Factors  rest, elevation, ice    Effect of Pain on Daily Activities  Daily activities have decreased    Multiple Pain Sites  No         [facility] PT Assessment - 10/31/18 0001      Assessment   Medical Diagnosis  L peroneus brevis repair    Referring Provider (PT)  Aldean Baker MD    Onset Date/Surgical Date  09/17/18    Hand Dominance  Right    Next MD Visit  11/19/2018    Prior Therapy  none for this surgery      Precautions   Precautions  None      Restrictions   Weight Bearing Restrictions  No      Balance Screen   Has the patient fallen in the past 6 months  No    Has the patient had a decrease in activity level because of a fear of falling?   Yes    Is the patient reluctant  to leave their home because of a fear of falling?   No      Home Environment   Living Environment  Private residence    Living Arrangements  Spouse/significant other;Children   spouse and son   Type of Home  House    Home Access  Stairs to enter    Entrance Stairs-Number of Steps  2    Home Layout  One level      Prior Function   Level of Independence  Independent      Cognition   Overall Cognitive Status  Within Functional Limits for tasks assessed      Observation/Other Assessments   Observations  Incision has healed well.     Skin Integrity  warm, dry and intact       Observation/Other Assessments-Edema    Edema  Circumferential;Figure 8      Circumferential Edema   Circumferential - Right  24.1 cm at malleolus    Circumferential - Left   26 cm at malleolus      Figure 8 Edema   Figure 8 - Right   54 cm    Figure 8 - Left   55 cm       ROM / Strength   AROM / PROM / Strength  AROM;Strength      AROM   AROM Assessment Site  Ankle    Right/Left Ankle  Right;Left    Right Ankle Dorsiflexion  2    Right Ankle Plantar Flexion  19    Right Ankle Inversion  20    stretching pain   Right Ankle Eversion  0    Left Ankle Dorsiflexion  2    Left Ankle Plantar Flexion  38    Left Ankle Inversion  40    Left Ankle Eversion  8      Strength   Strength Assessment Site  Ankle    Right/Left Ankle  Right;Left    Right Ankle Dorsiflexion  5/5    Right Ankle Plantar Flexion  5/5    Right Ankle Inversion  5/5    Right Ankle Eversion  5/5    Left Ankle Dorsiflexion  4+/5    Left Ankle Plantar Flexion  3/5    Left Ankle Inversion  4/5    Left Ankle Eversion  2+/5      Flexibility   Soft Tissue Assessment /Muscle Length  --      Ambulation/Gait   Ambulation/Gait  Yes    Ambulation/Gait Assistance  7: Independent    Gait Pattern  Decreased stride length    Ambulation Surface  Level    Gait Comments  Pt has decreased stance time on the L with improved push off on the L.                 Objective measurements completed on examination: See above findings.      [facility] Adult PT Treatment/Exercise - 10/31/18 0001      Exercises   Exercises  Ankle      Ankle Exercises: Seated   ABC's  1 rep    ABC's Limitations  Pt requires manual VC to prevent hip rotation     Towel Inversion/Eversion Limitations  20x VC to prevent hip rotation compensation    Heel Raises  Left;3 seconds;20 reps    Heel Raises Limitations  VC for slow lower    Toe Raise  20 reps    Toe Raise Limitations  VC for slower lower  Other Seated Ankle Exercises  Gastroc stretch with towel 60 seconds    Other Seated Ankle Exercises  Eversion stretch w/towel 60 seconds VC for direction and education on muscle she is stretching (posterior tibialis)             PT Education - 10/31/18 1600    Education Details  Access Code: [email or web address], Discussed the importance of getting motion back in certain muscle groups before strengthening. Re-iterated rest, ice, compression and elevation.    Person(s) Educated  Patient    Methods  Explanation;Demonstration;Tactile cues;Verbal cues     Comprehension  Verbalized understanding;Returned demonstration       PT Short Term Goals - 10/31/18 1705      PT SHORT TERM GOAL #1   Title  Pt will demonstrate improve ROM in her L ankle to 5 degrees eversion and 30 degrees plantar flexion.    Baseline  0 degrees eversion, 19 degrees plantar flexion    Time  2    Period  Weeks    Status  New    Target Date  11/21/18        PT Long Term Goals - 10/31/18 1706      PT LONG TERM GOAL #1   Title  Pt will improve strenght in the L ankle to 4/5 strength into eversion and plantar flexion within 4 weeks for improved functional strength for adequate gait.    Baseline  2+/5 eversion, 3/5 plantar flexion    Time  4    Period  Weeks    Status  New    Target Date  12/05/18      PT LONG TERM GOAL #2   Title  Pt will demonstrate equal WB on BLE in order to decrease risk for injury due to uneven wear on joints within 4 weeks.    Baseline  Pt has decreased WB on the LLE and impaired toe off due to weakness/decrease ROM into PF    Time  4    Period  Weeks    Status  New    Target Date  12/05/18      PT LONG TERM GOAL #3   Title  Pt will report that she is ambulating 1/2 mile for exercise within 4 weeks in order to return to regular self care activities to decrease risk for immobility.    Baseline  Pt is not walking for exercise    Time  4    Period  Weeks    Status  New    Target Date  12/05/18             Plan - 10/31/18 1657    Clinical Impression Statement  Pt resents to physical therapy status post peroneus brevis repair on 09/17/18. She has been WBAT since 2 weeks after her surgery. Pt presents with significant weakness in her L ankle especially in eversion and plantar flexion compared to the R. She has decreased ROM significantly into eversion on the L compared to the R. Pt is reporting only mild pain but has decreased her activity and is no longer participating in walking for exercise due to pain. She does have edema in the  L ankle 2 cm greater than the R; pt is wearing compression at ankle height regularly. Discussed possibly increasing to knee high compression if she has significant indentation. Pt will benefit from skilled physical therapy services to address the above limitatoins 2x/week for 4 weeks.    Stability/Clinical Decision Making  Stable/Uncomplicated  Clinical Decision Making  Low    Rehab Potential  Excellent    PT Frequency  2x / week    PT Duration  4 weeks    PT Treatment/Interventions  Electrical Stimulation;Moist Heat;Cryotherapy;Gait training;Stair training;Functional mobility training;Therapeutic activities;Therapeutic exercise;Balance training;Neuromuscular re-education;Patient/family education;Manual techniques;Vasopneumatic Device    PT Next Visit Plan  Assess HEP, STM to the surrounding musculture, Balance activities.    PT Home Exercise Plan  [facility]: [email or web address]    Consulted and Agree with Plan of Care  Patient       Patient will benefit from skilled therapeutic intervention in order to improve the following deficits and impairments:  Abnormal gait, Decreased balance, Increased edema, Decreased range of motion, Decreased activity tolerance, Decreased strength  Visit Diagnosis: Peroneal tendon rupture, left, subsequent encounter  Chronic pain of left ankle  Other abnormalities of gait and mobility     Problem List Patient Active Problem List   Diagnosis Date Noted  . Peroneal tendon rupture   . Chronic pain of left ankle 12/07/2017    Claudia Desanctisatherine H Dianca Owensby, PT 10/31/2018, 5:10 PM  [facility] [address] [place], [place], [address] Phone: [phone]   Fax:  [phone]  Name: Anna Wade MRN: [id number] Date of Birth: [birth year]

## 2018-10-31 NOTE — Patient Instructions (Signed)
Access Code: [facility]  URL: [email or web address]  Date: 10/31/2018  Prepared by: Tomma Rakers   Exercises Seated Ankle Alphabet - 1 reps - 1 sets - 1x daily - 7x weekly Seated Gastroc Stretch with Strap - 1 reps - 1 sets - 30-60 seconds hold - 1x daily - 7x weekly Long Sitting Ankle PROM Inversion Eversion - 1 reps - 1 sets - 30-60 seconds hold - 1x daily - 7x weekly Seated Heel Raise - 20 reps - 1 sets - 1x daily - 7x weekly Seated Toe Raise - 20 reps - 1 sets - 1x daily - 7x weekly Ankle Inversion Eversion Towel Slide - 20 reps - 1 sets - 1x daily - 7x weekly

## 2018-11-11 ENCOUNTER — Ambulatory Visit: Payer: 59 | Admitting: Physical Therapy

## 2018-11-11 ENCOUNTER — Other Ambulatory Visit: Payer: Self-pay

## 2018-11-11 DIAGNOSIS — S86312D Strain of muscle(s) and tendon(s) of peroneal muscle group at lower leg level, left leg, subsequent encounter: Secondary | ICD-10-CM

## 2018-11-11 DIAGNOSIS — G8929 Other chronic pain: Secondary | ICD-10-CM

## 2018-11-11 DIAGNOSIS — R2689 Other abnormalities of gait and mobility: Secondary | ICD-10-CM

## 2018-11-11 DIAGNOSIS — M25572 Pain in left ankle and joints of left foot: Secondary | ICD-10-CM

## 2018-11-12 NOTE — Therapy (Signed)
[facility] [address] [place], [place], [address] Phone: [phone]   Fax:  [phone]  Physical Therapy Treatment  Patient Details  Name: Anna Wade MRN: [id number] Date of Birth: 02-17-1965 Referring Provider (PT): Aldean Baker MD   Encounter Date: 11/11/2018  PT End of Session - 11/12/18 0750    Visit Number  2    Number of Visits  9    Date for PT Re-Evaluation  12/06/18    Authorization Type  [employer]    PT Start Time  1135    PT Stop Time  1220    PT Time Calculation (min)  45 min    Activity Tolerance  Patient tolerated treatment well    Behavior During Therapy  [facility] for tasks assessed/performed       Past Medical History:  Diagnosis Date  . PONV (postoperative nausea and vomiting)     Past Surgical History:  Procedure Laterality Date  . CESAREAN SECTION     x2  . ENDOMETRIAL ABLATION  2006  . TENDON REPAIR Left 09/17/2018   Procedure: Reinforce Peroneal Brevis with Peroneal Longus Left;  Surgeon: Nadara Mustard, MD;  Location: [facility];  Service: Orthopedics;  Laterality: Left;    There were no vitals filed for this visit.  Subjective Assessment - 11/11/18 1211    Subjective  Pain not as bad today about 3/10 in her Lt ankle, upon arrival but having swelling and cant walk as far as she would like    Limitations  Walking;Standing    How long can you sit comfortably?  no change    How long can you stand comfortably?  20 minutes    How long can you walk comfortably?  20 minutes    Patient Stated Goals  I would like to get back to walking for exercise and decrease the pain.    Pain Onset  1 to 4 weeks ago       [facility] Adult PT Treatment/Exercise - 11/12/18 0001      Exercises   Exercises  Ankle      Modalities   Modalities  Vasopneumatic;Ultrasound      Ultrasound   Ultrasound Location  Lt lateral ankle    Ultrasound Parameters  50%, 1.0 mhz, 1.0 w/cm2, 8 min    Ultrasound Goals   Edema;Pain      Vasopneumatic   Number Minutes Vasopneumatic   10 minutes    Vasopnuematic Location   Ankle    Vasopneumatic Pressure  Medium    Vasopneumatic Temperature   34      Manual Therapy   Manual therapy comments  ankle PROM all planes, manual heel cord stretching      Ankle Exercises: Aerobic   Recumbent Bike  5 min no resistance      Ankle Exercises: Standing   Other Standing Ankle Exercises  heel toe raises gentle X 10 ea, tandem balance 30 sec X 4      Ankle Exercises: Supine   T-Band  4 way with red X 15 each        PT Short Term Goals - 10/31/18 1705      PT SHORT TERM GOAL #1   Title  Pt will demonstrate improve ROM in her L ankle to 5 degrees eversion and 30 degrees plantar flexion.    Baseline  0 degrees eversion, 19 degrees plantar flexion    Time  2    Period  Weeks  Status  New    Target Date  11/21/18        PT Long Term Goals - 10/31/18 1706      PT LONG TERM GOAL #1   Title  Pt will improve strenght in the L ankle to 4/5 strength into eversion and plantar flexion within 4 weeks for improved functional strength for adequate gait.    Baseline  2+/5 eversion, 3/5 plantar flexion    Time  4    Period  Weeks    Status  New    Target Date  12/05/18      PT LONG TERM GOAL #2   Title  Pt will demonstrate equal WB on BLE in order to decrease risk for injury due to uneven wear on joints within 4 weeks.    Baseline  Pt has decreased WB on the LLE and impaired toe off due to weakness/decrease ROM into PF    Time  4    Period  Weeks    Status  New    Target Date  12/05/18      PT LONG TERM GOAL #3   Title  Pt will report that she is ambulating 1/2 mile for exercise within 4 weeks in order to return to regular self care activities to decrease risk for immobility.    Baseline  Pt is not walking for exercise    Time  4    Period  Weeks    Status  New    Target Date  12/05/18            Plan - 11/12/18 0754    Clinical Impression  Statement  Session focused on ankle ROM and strength followed by modalaties to decrease pain and overall inflammation, and edema. Continue POC    Stability/Clinical Decision Making  Stable/Uncomplicated    Rehab Potential  Excellent    PT Frequency  2x / week    PT Duration  4 weeks    PT Treatment/Interventions  Electrical Stimulation;Moist Heat;Cryotherapy;Gait training;Stair training;Functional mobility training;Therapeutic activities;Therapeutic exercise;Balance training;Neuromuscular re-education;Patient/family education;Manual techniques;Vasopneumatic Device    PT Next Visit Plan  Assess HEP, STM to the surrounding musculture, Balance activities.    PT Home Exercise Plan  Access Code: [facility]    Consulted and Agree with Plan of Care  Patient       Patient will benefit from skilled therapeutic intervention in order to improve the following deficits and impairments:  Abnormal gait, Decreased balance, Increased edema, Decreased range of motion, Decreased activity tolerance, Decreased strength  Visit Diagnosis: Peroneal tendon rupture, left, subsequent encounter  Other abnormalities of gait and mobility  Chronic pain of left ankle     Problem List Patient Active Problem List   Diagnosis Date Noted  . Peroneal tendon rupture   . Chronic pain of left ankle 12/07/2017    Anna Wade 11/12/2018, 8:05 AM  [facility] [address] [place], [place], [address] Phone: [phone]   Fax:  [phone]  Name: Anna Wade MRN: [id number] Date of Birth: Feb 01, 1965

## 2018-11-14 ENCOUNTER — Other Ambulatory Visit: Payer: Self-pay

## 2018-11-14 ENCOUNTER — Ambulatory Visit: Payer: 59 | Admitting: Physical Therapy

## 2018-11-14 DIAGNOSIS — M25572 Pain in left ankle and joints of left foot: Secondary | ICD-10-CM

## 2018-11-14 DIAGNOSIS — G8929 Other chronic pain: Secondary | ICD-10-CM

## 2018-11-14 DIAGNOSIS — S86312D Strain of muscle(s) and tendon(s) of peroneal muscle group at lower leg level, left leg, subsequent encounter: Secondary | ICD-10-CM

## 2018-11-14 DIAGNOSIS — R2689 Other abnormalities of gait and mobility: Secondary | ICD-10-CM

## 2018-11-14 NOTE — Therapy (Signed)
[facility] [address] [place], [place], [address] Phone: [phone]   Fax:  [phone]  Physical Therapy Treatment  Patient Details  Name: Anna Wade MRN: [id number] Date of Birth: [birth year] Referring Provider (PT): Aldean Baker MD   Encounter Date: 11/14/2018  PT End of Session - 11/14/18 1742    Visit Number  3    Number of Visits  9    Date for PT Re-Evaluation  12/06/18    Authorization Type  UHC    PT Start Time  1617    PT Stop Time  1710    PT Time Calculation (min)  53 min    Activity Tolerance  Patient tolerated treatment well    Behavior During Therapy  [facility] for tasks assessed/performed       Past Medical History:  Diagnosis Date  . PONV (postoperative nausea and vomiting)     Past Surgical History:  Procedure Laterality Date  . CESAREAN SECTION     x2  . ENDOMETRIAL ABLATION  2006  . TENDON REPAIR Left 09/17/2018   Procedure: Reinforce Peroneal Brevis with Peroneal Longus Left;  Surgeon: Nadara Mustard, MD;  Location: [facility];  Service: Orthopedics;  Laterality: Left;    There were no vitals filed for this visit.  Subjective Assessment - 11/14/18 1620    Subjective  Patient cont to have pain and swelling. No improvements yet.    Currently in Pain?  Yes    Pain Location  Ankle    Pain Orientation  Left;Lateral    Pain Descriptors / Indicators  Sore;Tightness    Pain Type  Surgical pain    Pain Onset  More than a month ago    Pain Frequency  Constant    Aggravating Factors   standing, walking    Pain Relieving Factors  RICE           [facility] Adult PT Treatment/Exercise - 11/14/18 0001      Neuro Re-ed    Neuro Re-ed Details   standing balance on foam and floor.  static, dynamic  eyes open, closed, light head turns       Vasopneumatic   Number Minutes Vasopneumatic   15 minutes    Vasopnuematic Location   Ankle    Vasopneumatic Pressure  Medium    Vasopneumatic  Temperature   34      Manual Therapy   Manual Therapy  Soft tissue mobilization    Manual therapy comments  ankle PROM all planes, manual heel cord stretching    Soft tissue mobilization  lateral ankle, painful, tender below lateral malleous , scar tissue work , gentle friction       Ankle Exercises: Aerobic   Recumbent Bike  5 min L2       Ankle Exercises: Standing   SLS  variations     Heel Raises  Both;10 reps   3 reps    Other Standing Ankle Exercises  tandem stance with nods and head turns      Ankle Exercises: Supine   T-Band  eversion x 20     Other Supine Ankle Exercises  circles using band with without       Ankle Exercises: Stretches   Other Stretch  hamstring and ITB with sheet, x 3 , 30 sec                PT Short Term Goals - 10/31/18 1705      PT SHORT  TERM GOAL #1   Title  Pt will demonstrate improve ROM in her L ankle to 5 degrees eversion and 30 degrees plantar flexion.    Baseline  0 degrees eversion, 19 degrees plantar flexion    Time  2    Period  Weeks    Status  New    Target Date  11/21/18        PT Long Term Goals - 10/31/18 1706      PT LONG TERM GOAL #1   Title  Pt will improve strenght in the L ankle to 4/5 strength into eversion and plantar flexion within 4 weeks for improved functional strength for adequate gait.    Baseline  2+/5 eversion, 3/5 plantar flexion    Time  4    Period  Weeks    Status  New    Target Date  12/05/18      PT LONG TERM GOAL #2   Title  Pt will demonstrate equal WB on BLE in order to decrease risk for injury due to uneven wear on joints within 4 weeks.    Baseline  Pt has decreased WB on the LLE and impaired toe off due to weakness/decrease ROM into PF    Time  4    Period  Weeks    Status  New    Target Date  12/05/18      PT LONG TERM GOAL #3   Title  Pt will report that she is ambulating 1/2 mile for exercise within 4 weeks in order to return to regular self care activities to decrease risk for  immobility.    Baseline  Pt is not walking for exercise    Time  4    Period  Weeks    Status  New    Target Date  12/05/18            Plan - 11/14/18 1623    Clinical Impression Statement  Worked on balance on compliant surfaces with light dynamic movements, ankle ROM and manual to reduce pain/swelling.  Vaso improved her pain.  Limited in balance with eyes closed    PT Treatment/Interventions  Electrical Stimulation;Moist Heat;Cryotherapy;Gait training;Stair training;Functional mobility training;Therapeutic activities;Therapeutic exercise;Balance training;Neuromuscular re-education;Patient/family education;Manual techniques;Vasopneumatic Device    PT Next Visit Plan  STM, Balance activities, ankle ROM and proprioception    PT Home Exercise Plan  Access Code: [facility]    Consulted and Agree with Plan of Care  Patient       Patient will benefit from skilled therapeutic intervention in order to improve the following deficits and impairments:  Abnormal gait, Decreased balance, Increased edema, Decreased range of motion, Decreased activity tolerance, Decreased strength  Visit Diagnosis: Peroneal tendon rupture, left, subsequent encounter  Other abnormalities of gait and mobility  Chronic pain of left ankle     Problem List Patient Active Problem List   Diagnosis Date Noted  . Peroneal tendon rupture   . Chronic pain of left ankle 12/07/2017    Mikiya Nebergall 11/14/2018, 5:48 PM  [facility] [address] [place], [place], [address] Phone: [phone]   Fax:  [phone]  Name: TYESHA JOFFE MRN: [id number] Date of Birth: 01/29/1966   Raeford Razor, PT 11/14/18 5:48 PM Phone: [phone] Fax: [phone]

## 2018-11-18 ENCOUNTER — Ambulatory Visit (INDEPENDENT_AMBULATORY_CARE_PROVIDER_SITE_OTHER): Payer: 59

## 2018-11-18 ENCOUNTER — Other Ambulatory Visit: Payer: Self-pay

## 2018-11-18 ENCOUNTER — Encounter: Payer: Self-pay | Admitting: Orthopedic Surgery

## 2018-11-18 ENCOUNTER — Ambulatory Visit: Payer: 59

## 2018-11-18 ENCOUNTER — Ambulatory Visit (INDEPENDENT_AMBULATORY_CARE_PROVIDER_SITE_OTHER): Payer: 59 | Admitting: Orthopedic Surgery

## 2018-11-18 VITALS — Ht 65.0 in | Wt 196.0 lb

## 2018-11-18 DIAGNOSIS — S86312D Strain of muscle(s) and tendon(s) of peroneal muscle group at lower leg level, left leg, subsequent encounter: Secondary | ICD-10-CM

## 2018-11-18 DIAGNOSIS — S86302S Unspecified injury of muscle(s) and tendon(s) of peroneal muscle group at lower leg level, left leg, sequela: Secondary | ICD-10-CM

## 2018-11-18 DIAGNOSIS — S86302D Unspecified injury of muscle(s) and tendon(s) of peroneal muscle group at lower leg level, left leg, subsequent encounter: Secondary | ICD-10-CM | POA: Diagnosis not present

## 2018-11-18 DIAGNOSIS — R2689 Other abnormalities of gait and mobility: Secondary | ICD-10-CM

## 2018-11-18 DIAGNOSIS — G8929 Other chronic pain: Secondary | ICD-10-CM

## 2018-11-18 DIAGNOSIS — M25572 Pain in left ankle and joints of left foot: Secondary | ICD-10-CM

## 2018-11-18 NOTE — Patient Instructions (Signed)
Braiding , side steps , walk back,  Heel walk daily

## 2018-11-18 NOTE — Therapy (Signed)
[place], [place], [address] Phone: [phone]   Fax:  [phone]  Physical Therapy Treatment  Patient Details  Name: Anna Wade MRN: [id number] Date of Birth: 02/06/1965 Referring Provider (PT): Meridee Score MD   Encounter Date: 11/18/2018  PT End of Session - 11/18/18 0749    Visit Number  4    Number of Visits  9    Date for PT Re-Evaluation  12/06/18    Authorization Type  UHC    PT Start Time  0748    PT Stop Time  0820    PT Time Calculation (min)  32 min    Activity Tolerance  Patient tolerated treatment well    Behavior During Therapy  [facility] for tasks assessed/performed       Past Medical History:  Diagnosis Date  . PONV (postoperative nausea and vomiting)     Past Surgical History:  Procedure Laterality Date  . CESAREAN SECTION     x2  . ENDOMETRIAL ABLATION  2006  . TENDON REPAIR Left 09/17/2018   Procedure: Reinforce Peroneal Brevis with Peroneal Longus Left;  Surgeon: Newt Minion, MD;  Location: [place];  Service: Orthopedics;  Laterality: Left;    There were no vitals filed for this visit.  Subjective Assessment - 11/18/18 0752    Subjective  mild pain stiff more.  Work conflict soonly 30 min    Pain Score  3     Pain Location  Ankle    Pain Orientation  Left;Lateral    Pain Descriptors / Indicators  Sore;Tightness    Pain Type  Surgical pain    Pain Onset  More than a month ago    Pain Frequency  Constant    Aggravating Factors   stand/ wa;lk    Pain Relieving Factors  RICE                       [facility] Adult PT Treatment/Exercise - 11/18/18 0001      Manual Therapy   Manual therapy comments  ankle PROM all planes, manual heel cord stretching, resisted inversion and eversion.     Soft tissue mobilization  Lateral lower leg and lateral calf       Ankle Exercises: Aerobic   Recumbent Bike  5 min L3      Ankle Exercises:  Stretches   Slant Board Stretch  3 reps;30 seconds  Each for gastroc and soleus     Ankle Exercises: Standing   Other Standing Ankle Exercises  sidesteps , braiding, backward, on heels . She reported she would do this at home/              PT Education - 11/18/18 0823    Education Details  HEP    Person(s) Educated  Patient    Methods  Explanation;Demonstration;Verbal cues    Comprehension  Verbalized understanding;Returned demonstration       PT Short Term Goals - 10/31/18 1705      PT SHORT TERM GOAL #1   Title  Pt will demonstrate improve ROM in her L ankle to 5 degrees eversion and 30 degrees plantar flexion.    Baseline  0 degrees eversion, 19 degrees plantar flexion    Time  2    Period  Weeks    Status  New    Target Date  11/21/18        PT Long Term Goals - 10/31/18  1706      PT LONG TERM GOAL #1   Title  Pt will improve strenght in the L ankle to 4/5 strength into eversion and plantar flexion within 4 weeks for improved functional strength for adequate gait.    Baseline  2+/5 eversion, 3/5 plantar flexion    Time  4    Period  Weeks    Status  New    Target Date  12/05/18      PT LONG TERM GOAL #2   Title  Pt will demonstrate equal WB on BLE in order to decrease risk for injury due to uneven wear on joints within 4 weeks.    Baseline  Pt has decreased WB on the LLE and impaired toe off due to weakness/decrease ROM into PF    Time  4    Period  Weeks    Status  New    Target Date  12/05/18      PT LONG TERM GOAL #3   Title  Pt will report that she is ambulating 1/2 mile for exercise within 4 weeks in order to return to regular self care activities to decrease risk for immobility.    Baseline  Pt is not walking for exercise    Time  4    Period  Weeks    Status  New    Target Date  12/05/18            Plan - 11/18/18 0751    Clinical Impression Statement  Had to leave early for work so session shorter.  Pain mild this AM. Feels stiff. She  reports decreased pain and stiffness  post session    PT Treatment/Interventions  Electrical Stimulation;Moist Heat;Cryotherapy;Gait training;Stair training;Functional mobility training;Therapeutic activities;Therapeutic exercise;Balance training;Neuromuscular re-education;Patient/family education;Manual techniques;Vasopneumatic Device    PT Next Visit Plan  STM, Balance activities, ankle ROM and proprioception    PT Home Exercise Plan  Access Code: [email or web address].                    side steps, backward walk , walk heels  braiding    Consulted and Agree with Plan of Care  Patient       Patient will benefit from skilled therapeutic intervention in order to improve the following deficits and impairments:  Abnormal gait, Decreased balance, Increased edema, Decreased range of motion, Decreased activity tolerance, Decreased strength  Visit Diagnosis: Peroneal tendon rupture, left, subsequent encounter  Other abnormalities of gait and mobility  Chronic pain of left ankle     Problem List Patient Active Problem List   Diagnosis Date Noted  . Peroneal tendon rupture   . Chronic pain of left ankle 12/07/2017    Anna Wade  PT 11/18/2018, 8:24 AM  [facility] [address] [place], [place], [address] Phone: [phone]   Fax:  [phone]  Name: Anna Wade MRN: [id number] Date of Birth: [birth year]

## 2018-11-19 ENCOUNTER — Ambulatory Visit: Payer: 59 | Admitting: Orthopedic Surgery

## 2018-11-20 ENCOUNTER — Ambulatory Visit: Payer: 59 | Admitting: Physical Therapy

## 2018-11-20 ENCOUNTER — Encounter: Payer: Self-pay | Admitting: Physical Therapy

## 2018-11-20 ENCOUNTER — Other Ambulatory Visit: Payer: Self-pay

## 2018-11-20 DIAGNOSIS — S86312D Strain of muscle(s) and tendon(s) of peroneal muscle group at lower leg level, left leg, subsequent encounter: Secondary | ICD-10-CM | POA: Diagnosis not present

## 2018-11-20 DIAGNOSIS — G8929 Other chronic pain: Secondary | ICD-10-CM

## 2018-11-20 DIAGNOSIS — R2689 Other abnormalities of gait and mobility: Secondary | ICD-10-CM

## 2018-11-20 NOTE — Therapy (Signed)
[facility] Outpatient Rehabilitation [facility] [address] [place], [place], [address] Phone: [phone]   Fax:  [phone]  Physical Therapy Treatment  Patient Details  Name: Anna Wade MRN: [id number] Date of Birth: 15-Jul-1965 Referring Provider (PT): Aldean Baker MD   Encounter Date: 11/20/2018  PT End of Session - 11/20/18 1655    Visit Number  5    Number of Visits  9    Date for PT Re-Evaluation  12/06/18    Authorization Type  UHC    PT Start Time  1618    PT Stop Time  1710    PT Time Calculation (min)  52 min    Activity Tolerance  Patient tolerated treatment well    Behavior During Therapy  [facility] for tasks assessed/performed       Past Medical History:  Diagnosis Date  . PONV (postoperative nausea and vomiting)     Past Surgical History:  Procedure Laterality Date  . CESAREAN SECTION     x2  . ENDOMETRIAL ABLATION  2006  . TENDON REPAIR Left 09/17/2018   Procedure: Reinforce Peroneal Brevis with Peroneal Longus Left;  Surgeon: Nadara Mustard, MD;  Location: [facility];  Service: Orthopedics;  Laterality: Left;    There were no vitals filed for this visit.  Subjective Assessment - 11/20/18 1622    Subjective  Today is the stiffest it has been and soreness .  The MD took and XR.  Progressing according to the doctor.    Currently in Pain?  Yes    Pain Score  4     Pain Location  Ankle    Pain Orientation  Left    Pain Descriptors / Indicators  Sore    Pain Type  Surgical pain    Pain Onset  More than a month ago    Pain Frequency  Constant            [facility] Adult PT Treatment/Exercise - 11/20/18 0001      Vasopneumatic   Number Minutes Vasopneumatic   15 minutes    Vasopnuematic Location   Ankle    Vasopneumatic Pressure  Medium    Vasopneumatic Temperature   34      Manual Therapy   Manual Therapy  Edema management;Soft tissue mobilization;Passive ROM;Taping    Edema Management  retrograde massage, no  lotion    Soft tissue mobilization  lateral lower leg    Passive ROM  ankle DF, eversion, and inversion     Kinesiotex  Edema      Kinesiotix   Edema  2 fans for edema L ankle       Ankle Exercises: Standing   SLS  variations: 2 sets added hip hinge x 10 each leg, with and without UE assist     Rocker Board  5 minutes   static, dynaminc ankle A/P and M/L stability sing, double   Heel Raises  Both;15 reps      Ankle Exercises: Supine   Other Supine Ankle Exercises  ankle pumps with elevatio               PT Short Term Goals - 10/31/18 1705      PT SHORT TERM GOAL #1   Title  Pt will demonstrate improve ROM in her L ankle to 5 degrees eversion and 30 degrees plantar flexion.    Baseline  0 degrees eversion, 19 degrees plantar flexion    Time  2    Period  Weeks    Status  New    Target Date  11/21/18        PT Long Term Goals - 10/31/18 1706      PT LONG TERM GOAL #1   Title  Pt will improve strenght in the L ankle to 4/5 strength into eversion and plantar flexion within 4 weeks for improved functional strength for adequate gait.    Baseline  2+/5 eversion, 3/5 plantar flexion    Time  4    Period  Weeks    Status  New    Target Date  12/05/18      PT LONG TERM GOAL #2   Title  Pt will demonstrate equal WB on BLE in order to decrease risk for injury due to uneven wear on joints within 4 weeks.    Baseline  Pt has decreased WB on the LLE and impaired toe off due to weakness/decrease ROM into PF    Time  4    Period  Weeks    Status  New    Target Date  12/05/18      PT LONG TERM GOAL #3   Title  Pt will report that she is ambulating 1/2 mile for exercise within 4 weeks in order to return to regular self care activities to decrease risk for immobility.    Baseline  Pt is not walking for exercise    Time  4    Period  Weeks    Status  New    Target Date  12/05/18            Plan - 11/20/18 1624    Clinical Impression Statement  Pt more swollen  today, was not as active today at work.  Taped for edema.  No pain increase with exercises in standing.    PT Treatment/Interventions  Electrical Stimulation;Moist Heat;Cryotherapy;Gait training;Stair training;Functional mobility training;Therapeutic activities;Therapeutic exercise;Balance training;Neuromuscular re-education;Patient/family education;Manual techniques;Vasopneumatic Device    PT Next Visit Plan  check goals! did tape help? STM, Balance activities, ankle ROM and proprioception    PT Home Exercise Plan  Access Code: [email or web address].                    side steps, backward walk , walk heels  braiding    Consulted and Agree with Plan of Care  Patient       Patient will benefit from skilled therapeutic intervention in order to improve the following deficits and impairments:  Abnormal gait, Decreased balance, Increased edema, Decreased range of motion, Decreased activity tolerance, Decreased strength  Visit Diagnosis: Peroneal tendon rupture, left, subsequent encounter  Other abnormalities of gait and mobility  Chronic pain of left ankle     Problem List Patient Active Problem List   Diagnosis Date Noted  . Peroneal tendon rupture   . Chronic pain of left ankle 12/07/2017    Virgil Lightner 11/20/2018, 4:59 PM  [facility] [address] [place], [place], [address] Phone: [phone]   Fax:  [phone]  Name: Anna Wade MRN: [id number] Date of Birth: [birth year]  Raeford Razor, PT 11/20/18 4:59 PM Phone: [phone] Fax: [phone]

## 2018-11-25 ENCOUNTER — Other Ambulatory Visit: Payer: Self-pay

## 2018-11-25 ENCOUNTER — Ambulatory Visit: Payer: 59

## 2018-11-25 DIAGNOSIS — S86312D Strain of muscle(s) and tendon(s) of peroneal muscle group at lower leg level, left leg, subsequent encounter: Secondary | ICD-10-CM | POA: Diagnosis not present

## 2018-11-25 DIAGNOSIS — R2689 Other abnormalities of gait and mobility: Secondary | ICD-10-CM

## 2018-11-25 DIAGNOSIS — G8929 Other chronic pain: Secondary | ICD-10-CM

## 2018-11-25 DIAGNOSIS — M25572 Pain in left ankle and joints of left foot: Secondary | ICD-10-CM

## 2018-11-25 NOTE — Therapy (Signed)
[place], [place], [address] Phone: [phone]   Fax:  [phone]  Physical Therapy Treatment  Patient Details  Name: Anna Wade MRN: [id number] Date of Birth: 1965/02/02 Referring Provider (PT): Meridee Score MD   Encounter Date: 11/25/2018  PT End of Session - 11/25/18 0804    Visit Number  6    Number of Visits  9    Date for PT Re-Evaluation  12/06/18    Authorization Type  UHC    PT Start Time  0800    PT Stop Time  0830    PT Time Calculation (min)  30 min    Activity Tolerance  Patient tolerated treatment well    Behavior During Therapy  [facility] for tasks assessed/performed       Past Medical History:  Diagnosis Date  . PONV (postoperative nausea and vomiting)     Past Surgical History:  Procedure Laterality Date  . CESAREAN SECTION     x2  . ENDOMETRIAL ABLATION  2006  . TENDON REPAIR Left 09/17/2018   Procedure: Reinforce Peroneal Brevis with Peroneal Longus Left;  Surgeon: Newt Minion, MD;  Location: [place];  Service: Orthopedics;  Laterality: Left;    There were no vitals filed for this visit.  Subjective Assessment - 11/25/18 0805    Subjective  She reports foot doing OK . Not any better or worse. Maybe some better.  Not sure  the tape made any difference    Pain Score  3     Pain Location  Ankle    Pain Orientation  Left    Pain Descriptors / Indicators  Sore    Pain Type  Surgical pain    Pain Onset  More than a month ago    Pain Frequency  Constant    Aggravating Factors   activity on feet    Pain Relieving Factors  RICE                       [facility] Adult PT Treatment/Exercise - 11/25/18 0001      Manual Therapy   Edema Management  retrograde massage,     Soft tissue mobilization  lateral lower leg      Ankle Exercises: Aerobic   Nustep  LE L5 5 min      Ankle Exercises: Standing   SLS  hip hinge LT leg stand  x 10 with UE  support,       Rocker Board  --   10 reps bilateral  10 reps Lt  then lateral x 20 each R/L   Heel Raises  Both;15 reps      Ankle Exercises: Stretches   Gastroc Stretch  1 rep;30 seconds               PT Short Term Goals - 10/31/18 1705      PT SHORT TERM GOAL #1   Title  Pt will demonstrate improve ROM in her L ankle to 5 degrees eversion and 30 degrees plantar flexion.    Baseline  0 degrees eversion, 19 degrees plantar flexion    Time  2    Period  Weeks    Status  New    Target Date  11/21/18        PT Long Term Goals - 11/25/18 0829      PT LONG TERM GOAL #1   Title  Pt will improve strenght in  the L ankle to 4/5 strength into eversion and plantar flexion within 4 weeks for improved functional strength for adequate gait.    Status  Unable to assess      PT LONG TERM GOAL #2   Title  Pt will demonstrate equal WB on BLE in order to decrease risk for injury due to uneven wear on joints within 4 weeks.    Baseline  no decr weigh to LT with wlaking today    Status  Achieved      PT LONG TERM GOAL #3   Status  Unable to assess            Plan - 11/25/18 0804    Clinical Impression Statement  apparently better than last session  No limp with walking . Late due to traffic issues.   pain slightly less than last session.    PT Treatment/Interventions  Electrical Stimulation;Moist Heat;Cryotherapy;Gait training;Stair training;Functional mobility training;Therapeutic activities;Therapeutic exercise;Balance training;Neuromuscular re-education;Patient/family education;Manual techniques;Vasopneumatic Device    PT Home Exercise Plan  Access Code: [facility].                    side steps, backward walk , walk heels  braiding    Consulted and Agree with Plan of Care  Patient       Patient will benefit from skilled therapeutic intervention in order to improve the following deficits and impairments:  Abnormal gait, Decreased balance, Increased edema, Decreased range of  motion, Decreased activity tolerance, Decreased strength  Visit Diagnosis: Peroneal tendon rupture, left, subsequent encounter  Other abnormalities of gait and mobility  Chronic pain of left ankle     Problem List Patient Active Problem List   Diagnosis Date Noted  . Peroneal tendon rupture   . Chronic pain of left ankle 12/07/2017    Caprice Red  PT 11/25/2018, 8:30 AM  [facility] [address] [place], [place], [address] Phone: [phone]   Fax:  [phone]  Name: Anna Wade MRN: [id number] Date of Birth: 03-01-1965

## 2018-11-28 ENCOUNTER — Ambulatory Visit: Payer: 59

## 2018-11-28 ENCOUNTER — Other Ambulatory Visit: Payer: Self-pay

## 2018-11-28 DIAGNOSIS — S86312D Strain of muscle(s) and tendon(s) of peroneal muscle group at lower leg level, left leg, subsequent encounter: Secondary | ICD-10-CM

## 2018-11-28 DIAGNOSIS — G8929 Other chronic pain: Secondary | ICD-10-CM

## 2018-11-28 DIAGNOSIS — M25572 Pain in left ankle and joints of left foot: Secondary | ICD-10-CM

## 2018-11-28 DIAGNOSIS — R2689 Other abnormalities of gait and mobility: Secondary | ICD-10-CM

## 2018-11-28 NOTE — Therapy (Addendum)
[place], [place], [address] Phone: [phone]   Fax:  [phone]  Physical Therapy Treatment/discharge  Patient Details  Name: Anna Wade MRN: [id number] Date of Birth: December 31, 1965 Referring Provider (PT): Meridee Score MD   Encounter Date: 11/28/2018  PT End of Session - 11/28/18 0831    Visit Number  7    Number of Visits  9    Date for PT Re-Evaluation  12/06/18    Authorization Type  [employer]    PT Start Time  0830    PT Stop Time  0925    PT Time Calculation (min)  55 min    Activity Tolerance  Patient tolerated treatment well    Behavior During Therapy  [facility] for tasks assessed/performed       Past Medical History:  Diagnosis Date  . PONV (postoperative nausea and vomiting)     Past Surgical History:  Procedure Laterality Date  . CESAREAN SECTION     x2  . ENDOMETRIAL ABLATION  2006  . TENDON REPAIR Left 09/17/2018   Procedure: Reinforce Peroneal Brevis with Peroneal Longus Left;  Surgeon: Newt Minion, MD;  Location: [place];  Service: Orthopedics;  Laterality: Left;    There were no vitals filed for this visit.  Subjective Assessment - 11/28/18 0835    Subjective  2/10 pain . Feel ok today    Pain Score  2     Pain Location  Ankle    Pain Orientation  Left    Pain Descriptors / Indicators  Sore    Pain Type  Surgical pain    Pain Onset  More than a month ago    Pain Frequency  Constant    Aggravating Factors   active on feet    Pain Relieving Factors  RICE         [facility] PT Assessment - 11/28/18 0001      Assessment   Medical Diagnosis  L peroneus brevis repair    Referring Provider (PT)  Meridee Score MD      AROM   Left Ankle Dorsiflexion  10    Left Ankle Plantar Flexion  62    Left Ankle Inversion  42    Left Ankle Eversion  10                   [facility] Adult PT Treatment/Exercise - 11/28/18 0001      Vasopneumatic   Number Minutes  Vasopneumatic   15 minutes    Vasopnuematic Location   Ankle    Vasopneumatic Pressure  Medium    Vasopneumatic Temperature   34      Manual Therapy   Edema Management  retrograde massage,     Soft tissue mobilization  lateral lower leg  scar mobs    Passive ROM  ankle DF, eversion, and inversion       Ankle Exercises: Aerobic   Nustep  LE L5 5 min      Ankle Exercises: Standing   BAPS  Level 3;Standing;15 reps   CW/CCW   Vector Stance  Left   side ,forward and back   Heel Raises  Both;15 reps   then 12 eccentric LT    Other Standing Ankle Exercises  Heel raises x 20      Ankle Exercises: Stretches   Slant Board Stretch  2 reps;30 seconds  PT Short Term Goals - 11/28/18 0907      PT SHORT TERM GOAL #1   Title  Pt will demonstrate improve ROM in her L ankle to 5 degrees eversion and 30 degrees plantar flexion.    Status  Achieved        PT Long Term Goals - 11/28/18 4627      PT LONG TERM GOAL #1   Title  Pt will improve strenght in the L ankle to 4/5 strength into eversion and plantar flexion within 4 weeks for improved functional strength for adequate gait.    Status  Achieved      PT LONG TERM GOAL #2   Title  Pt will demonstrate equal WB on BLE in order to decrease risk for injury due to uneven wear on joints within 4 weeks.    Baseline  no decr weigh to LT with wlaking today    Status  Achieved      PT LONG TERM GOAL #3   Title  Pt will report that she is ambulating 1/2 mile for exercise within 4 weeks in order to return to regular self care activities to decrease risk for immobility.            Plan - 11/28/18 0914    Clinical Impression Statement  Progressing with good ROM and min pain SOre post exercise.  Decide on extension.    PT Treatment/Interventions  Electrical Stimulation;Moist Heat;Cryotherapy;Gait training;Stair training;Functional mobility training;Therapeutic activities;Therapeutic exercise;Balance  training;Neuromuscular re-education;Patient/family education;Manual techniques;Vasopneumatic Device    PT Next Visit Plan  STM, Balance activities, ankle ROM and proprioception    PT Home Exercise Plan  Access Code: [email or web address].                    side steps, backward walk , walk heels  braiding    Consulted and Agree with Plan of Care  Patient       Patient will benefit from skilled therapeutic intervention in order to improve the following deficits and impairments:  Abnormal gait, Decreased balance, Increased edema, Decreased range of motion, Decreased activity tolerance, Decreased strength  Visit Diagnosis: Peroneal tendon rupture, left, subsequent encounter  Other abnormalities of gait and mobility  Chronic pain of left ankle     Problem List Patient Active Problem List   Diagnosis Date Noted  . Peroneal tendon rupture   . Chronic pain of left ankle 12/07/2017    Darrel Hoover PT 11/29/2018, 7:11 AM  [facility] [address] [place], [place], [address] Phone: [phone]   Fax:  [phone]  Name: Anna Wade MRN: [id number] Date of Birth: 1965/09/18  PHYSICAL THERAPY DISCHARGE SUMMARY  Visits from Start of Care: 7  Current functional level related to goals / functional outcomes: See above   Remaining deficits: See above   Education / Equipment: HEP Plan: Patient agrees to discharge.  Patient goals were partially met. Patient is being discharged due to meeting the stated rehab goals.  ?????  Pearson Forster PT   01/02/19

## 2018-12-03 ENCOUNTER — Other Ambulatory Visit: Payer: Self-pay

## 2018-12-03 ENCOUNTER — Ambulatory Visit: Payer: 59 | Attending: Orthopedic Surgery

## 2018-12-03 ENCOUNTER — Encounter: Payer: Self-pay | Admitting: Orthopedic Surgery

## 2018-12-03 DIAGNOSIS — M25572 Pain in left ankle and joints of left foot: Secondary | ICD-10-CM | POA: Diagnosis present

## 2018-12-03 DIAGNOSIS — S86312D Strain of muscle(s) and tendon(s) of peroneal muscle group at lower leg level, left leg, subsequent encounter: Secondary | ICD-10-CM

## 2018-12-03 DIAGNOSIS — R2689 Other abnormalities of gait and mobility: Secondary | ICD-10-CM | POA: Diagnosis present

## 2018-12-03 DIAGNOSIS — G8929 Other chronic pain: Secondary | ICD-10-CM | POA: Diagnosis present

## 2018-12-03 NOTE — Progress Notes (Signed)
   Office Visit Note   Patient: Anna Wade           Date of Birth: [birth year]           MRN: [id number] Visit Date: 11/18/2018              Requested by: No referring provider defined for this encounter. PCP: Darcus Austin, MD (Inactive)  Chief Complaint  Patient presents with  . Left Ankle - Routine Post Op    09/17/2018 left ankle reinforce peroneal brevis longus      HPI: Patient is a 53 year old woman who is 2 months status post reinforcement of the peroneus brevis with the peroneus longus.  She is currently wearing compression socks she states the incision is tender and feels like she has a mass proximal to the lateral malleolus.  Assessment & Plan: Visit Diagnoses:  1. Peroneal tendon injury, left, sequela     Plan: Patient will continue with her physical therapy for proprioception and strengthening.  Also recommended scar massage and range of motion of her ankle.  Follow-Up Instructions: Return in about 4 weeks (around 12/16/2018).   Ortho Exam  Patient is alert, oriented, no adenopathy, well-dressed, normal affect, normal respiratory effort. Examination the incision is well-healed patient has good peroneus brevis strength with resisted eversion.  She is developing some thickening of the scar recommended scar massage.  Imaging: No results found. No images are attached to the encounter.  Labs: No results found for: HGBA1C, ESRSEDRATE, CRP, LABURIC, REPTSTATUS, GRAMSTAIN, CULT, LABORGA   No results found for: ALBUMIN, PREALBUMIN, LABURIC  No results found for: MG No results found for: VD25OH  No results found for: PREALBUMIN No flowsheet data found.   Body mass index is 32.62 kg/m.  Orders:  Orders Placed This Encounter  Procedures  . XR Ankle Complete Left   No orders of the defined types were placed in this encounter.    Procedures: No procedures performed  Clinical Data: No additional findings.  ROS:  All other systems negative, except  as noted in the HPI. Review of Systems  Objective: Vital Signs: Ht 5\' 5"  (1.651 m)   Wt 196 lb (88.9 kg)   BMI 32.62 kg/m   Specialty Comments:  No specialty comments available.  PMFS History: Patient Active Problem List   Diagnosis Date Noted  . Peroneal tendon rupture   . Chronic pain of left ankle 12/07/2017   Past Medical History:  Diagnosis Date  . PONV (postoperative nausea and vomiting)     History reviewed. No pertinent family history.  Past Surgical History:  Procedure Laterality Date  . CESAREAN SECTION     x2  . ENDOMETRIAL ABLATION  2006  . TENDON REPAIR Left 09/17/2018   Procedure: Reinforce Peroneal Brevis with Peroneal Longus Left;  Surgeon: Newt Minion, MD;  Location: [place];  Service: Orthopedics;  Laterality: Left;   Social History   Occupational History  . Not on file  Tobacco Use  . Smoking status: Former Smoker    Quit date: 04/29/2004    Years since quitting: 14.6  . Smokeless tobacco: Never Used  Substance and Sexual Activity  . Alcohol use: Not on file    Comment: 1x a month beer  . Drug use: No  . Sexual activity: Not on file

## 2018-12-03 NOTE — Therapy (Addendum)
[place], [place], [address] Phone: [phone]   Fax:  [phone]  Physical Therapy Treatment/discharge  Patient Details  Name: Anna Wade MRN: [id number] Date of Birth: 08-16-1965 Referring Provider (PT): Meridee Score MD   Encounter Date: 12/03/2018  PT End of Session - 12/03/18 1449    Visit Number  8    Number of Visits  9    Date for PT Re-Evaluation  12/06/18    Authorization Type  [employer]HC    PT Start Time  0247    PT Stop Time  0335    PT Time Calculation (min)  48 min    Activity Tolerance  Patient tolerated treatment well    Behavior During Therapy  [facility] for tasks assessed/performed       Past Medical History:  Diagnosis Date  . PONV (postoperative nausea and vomiting)     Past Surgical History:  Procedure Laterality Date  . CESAREAN SECTION     x2  . ENDOMETRIAL ABLATION  2006  . TENDON REPAIR Left 09/17/2018   Procedure: Reinforce Peroneal Brevis with Peroneal Longus Left;  Surgeon: Newt Minion, MD;  Location: [place];  Service: Orthopedics;  Laterality: Left;    There were no vitals filed for this visit.  Subjective Assessment - 12/03/18 1452    Subjective  1-2/10 pain. Doing well.    Pain Score  2     Pain Location  Ankle    Pain Orientation  Left    Pain Descriptors / Indicators  Sore    Pain Type  Surgical pain    Pain Onset  More than a month ago    Pain Frequency  Intermittent    Aggravating Factors   activity on feet    Pain Relieving Factors  RI CE                       [facility] Adult PT Treatment/Exercise - 12/03/18 0001      Neuro Re-ed    Neuro Re-ed Details   standing balance on foam vectors x 15 stand  on LT foot.  walking heel to toe 15 feet  for and back x 4 each       Ankle Exercises: Aerobic   Recumbent Bike  L3 6 min      Ankle Exercises: Standing   BAPS  Standing;Level 4   25 reps cw and ccw   Heel Raises  Left;10  reps   2 sets LT foot   Balance Beam  forward and backward walking x 63 trips each with LOB x 1 each way.    Other Standing Ankle Exercises  Step ups 2x10 with heel off step    Other Standing Ankle Exercises  Heel raises x 30      Ankle Exercises: Stretches   Gastroc Stretch  3 reps;30 seconds   off step      Band exercises 4 way x 10 blue band       PT Education - 12/03/18 1533    Education Details  Discussed progress with Ms Bilton and need to contnue HEP for extended period of time (months) . She reported being pleased with progress but disappointed it had not progressed faster.    Person(s) Educated  Patient    Methods  Explanation    Comprehension  Verbalized understanding       PT Short Term Goals - 11/28/18 [phone]  PT SHORT TERM GOAL #1   Title  Pt will demonstrate improve ROM in her L ankle to 5 degrees eversion and 30 degrees plantar flexion.    Status  Achieved        PT Long Term Goals - 11/28/18 2423      PT LONG TERM GOAL #1   Title  Pt will improve strenght in the L ankle to 4/5 strength into eversion and plantar flexion within 4 weeks for improved functional strength for adequate gait.    Status  Achieved      PT LONG TERM GOAL #2   Title  Pt will demonstrate equal WB on BLE in order to decrease risk for injury due to uneven wear on joints within 4 weeks.    Baseline  no decr weigh to LT with wlaking today    Status  Achieved      PT LONG TERM GOAL #3   Title  Pt will report that she is ambulating 1/2 mile for exercise within 4 weeks in order to return to regular self care activities to decrease risk for immobility.            Plan - 12/03/18 1450    Clinical Impression Statement  Continues with good ROM and strength but pain continues when on feet for extended periods.  we will contniue x 1 then have her see Dr Sharol Given and follow up after this if Dr Sharol Given feels PT needed. She had no increase in pain and reported feeling weak / tired in  ankle/foot after the session.  She is doing quite well I thing. Marland Kitchen    PT Treatment/Interventions  Electrical Stimulation;Moist Heat;Cryotherapy;Gait training;Stair training;Functional mobility training;Therapeutic activities;Therapeutic exercise;Balance training;Neuromuscular re-education;Patient/family education;Manual techniques;Vasopneumatic Device    PT Next Visit Plan  STM, Balance activities, ankle ROM and proprioception  band exercises for ankle   MMT /ROM measures             FOTO    PT Home Exercise Plan  Access Code: [facility].                    side steps, backward walk , walk heels  braiding issued blue and so she can compare with bands at home.    Consulted and Agree with Plan of Care  Patient       Patient will benefit from skilled therapeutic intervention in order to improve the following deficits and impairments:  Abnormal gait, Decreased balance, Increased edema, Decreased range of motion, Decreased activity tolerance, Decreased strength  Visit Diagnosis: Peroneal tendon rupture, left, subsequent encounter  Other abnormalities of gait and mobility  Chronic pain of left ankle     Problem List Patient Active Problem List   Diagnosis Date Noted  . Peroneal tendon rupture   . Chronic pain of left ankle 12/07/2017    Darrel Hoover  PT 12/03/2018, 4:09 PM  [facility] [address] [place], [place], [address] Phone: [phone]   Fax:  [phone]  Name: Anna Wade MRN: [id number] Date of Birth: Jun 04, 1965  PHYSICAL THERAPY DISCHARGE SUMMARY  Visits from Start of Care: 8  Current functional level related to goals / functional outcomes: See above   Remaining deficits: See above   Education / Equipment: HEP Plan: Patient agrees to discharge.  Patient goals were met. Patient is being discharged due to meeting the stated rehab goals.  ?????    Pearson Forster   PT   01/02/19

## 2018-12-05 ENCOUNTER — Ambulatory Visit: Payer: 59

## 2018-12-07 IMAGING — MR MR ANKLE*L* W/O CM
4 of 5 series · 29 of 40 positions shown · non-contrast
Comparison: 12/07/2017 ankle radiographs

CLINICAL DATA: Left ankle pain and swelling with weakness. Steroid
injection along the lateral aspect of the left ankle 7 weeks ago has
not helped.

EXAM:
MRI OF THE LEFT ANKLE WITHOUT CONTRAST
TECHNIQUE: Multiplanar, multisequence MR imaging of the ankle was performed. No
intravenous contrast was administered.

[Series 3: T2 fat-sat · axial · 3.0mm · 0.62mm/px · z∈[-114,+22]mm · 9 of 39 slices shown (1 of 2)]
[im 1/39]
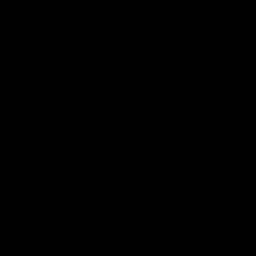
[im 5/39]
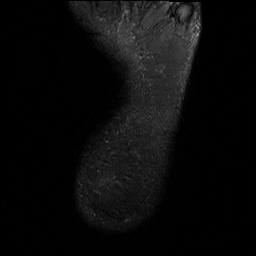
[im 10/39]
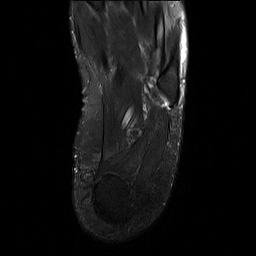
[im 15/39]
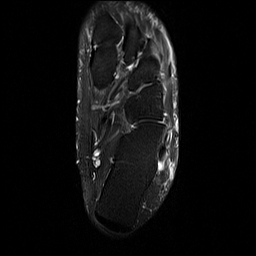
[im 20/39]
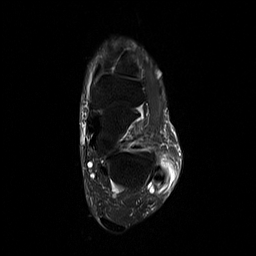
[im 24/39]
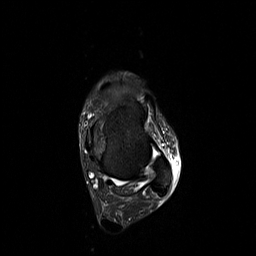
[im 29/39]
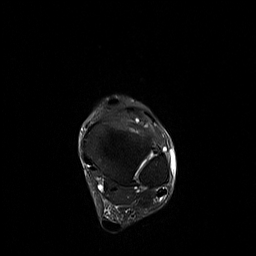
[im 34/39]
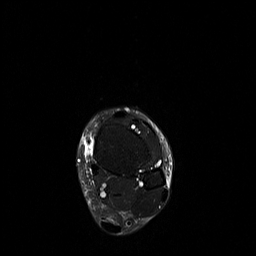
[im 39/39]
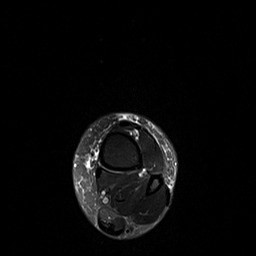

[Series 4: PD fat-sat · axial · 3.0mm · 0.50mm/px · z∈[-114,+22]mm · 9 of 39 slices shown]
[im 1/39]
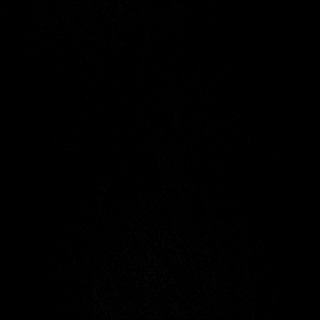
[im 5/39]
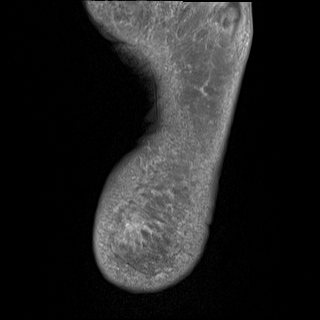
[im 10/39]
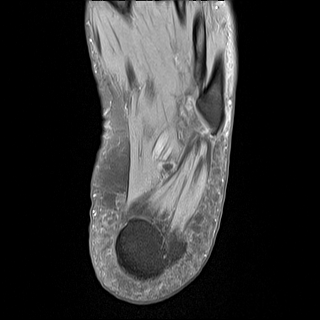
[im 15/39]
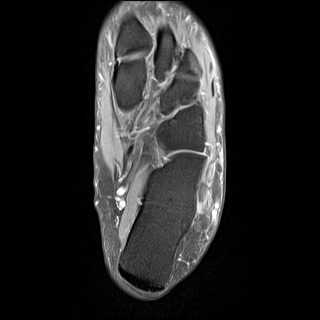
[im 20/39]
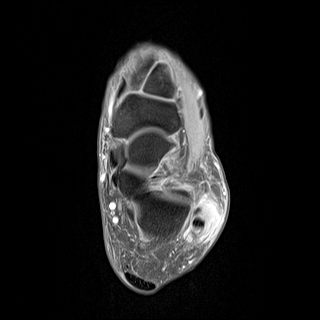
[im 24/39]
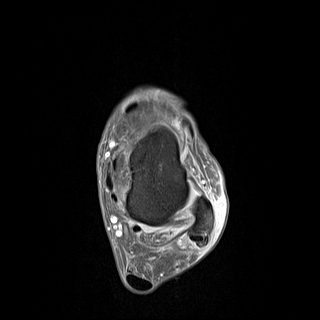
[im 29/39]
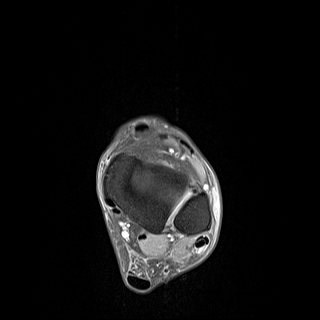
[im 34/39]
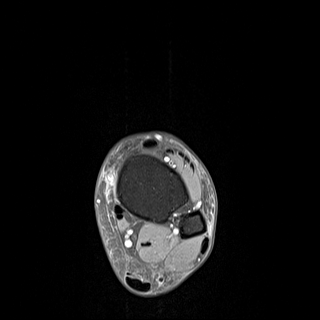
[im 39/39]
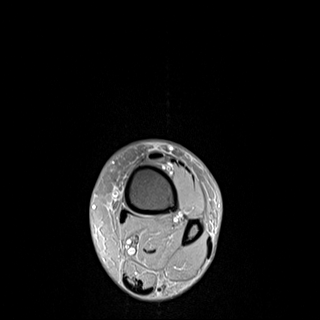

[Series 5: T1 · sagittal · 3.0mm · 0.25mm/px · 3 of 24 slices shown]
[im 5/24]
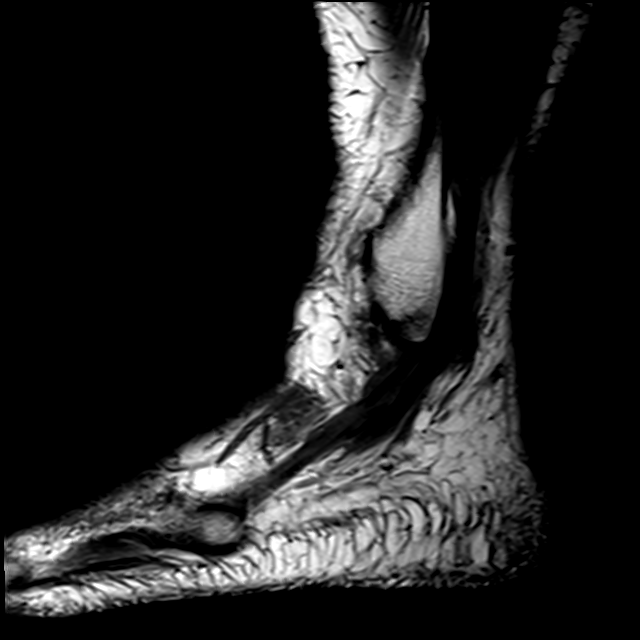
[im 14/24]
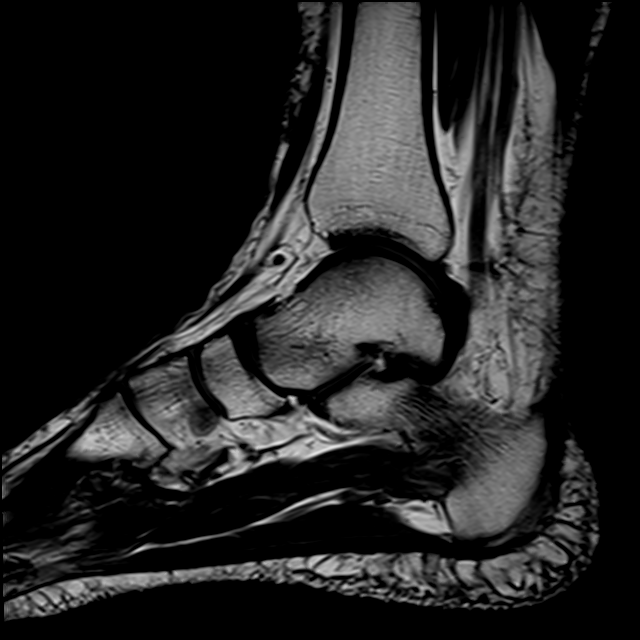
[im 24/24]
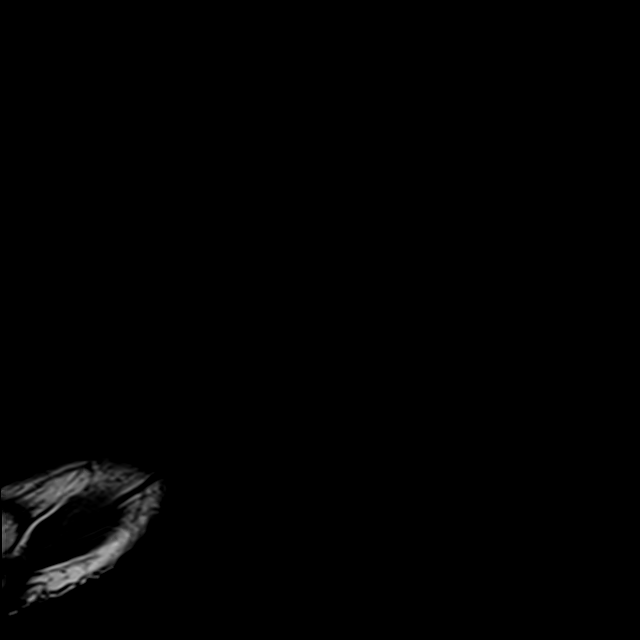

[Series 7: T2 fat-sat · coronal · 3.0mm · 0.31mm/px · 8 of 42 slices shown (2 of 2)]
[im 1/42]
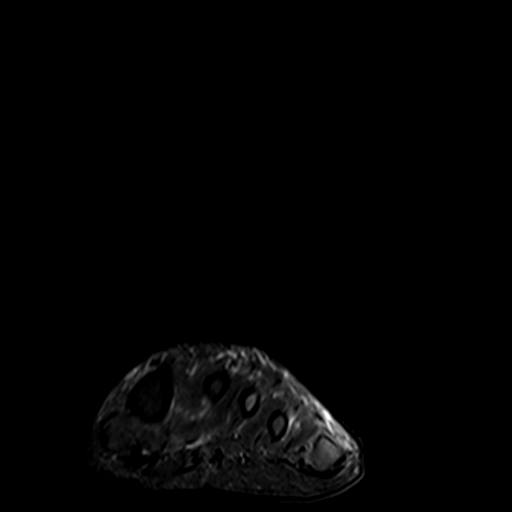
[im 5/42]
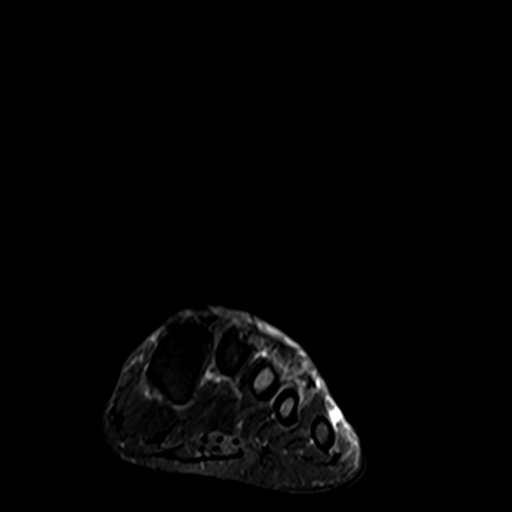
[im 14/42]
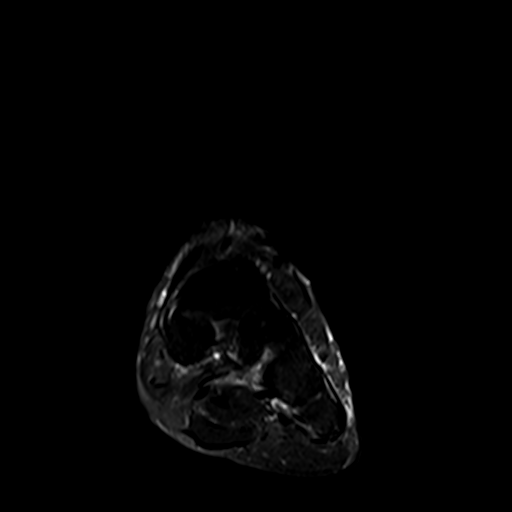
[im 19/42]
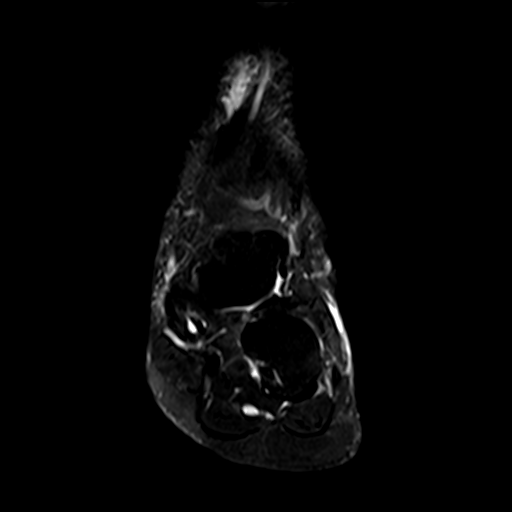
[im 23/42]
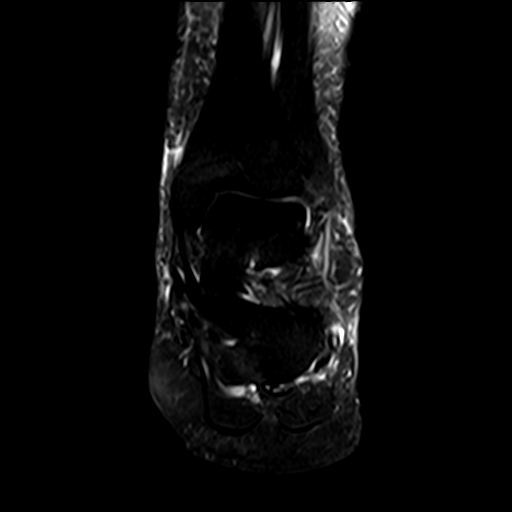
[im 28/42]
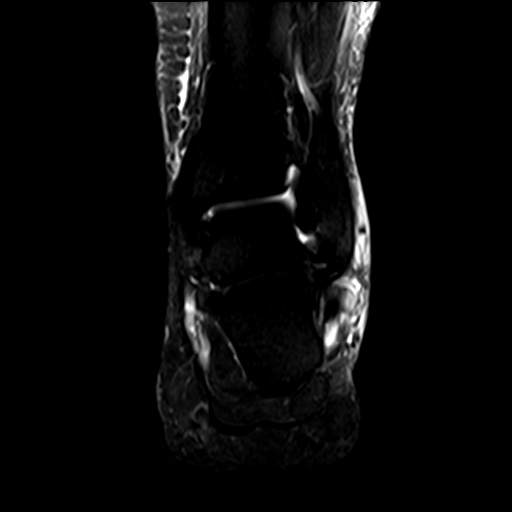
[im 37/42]
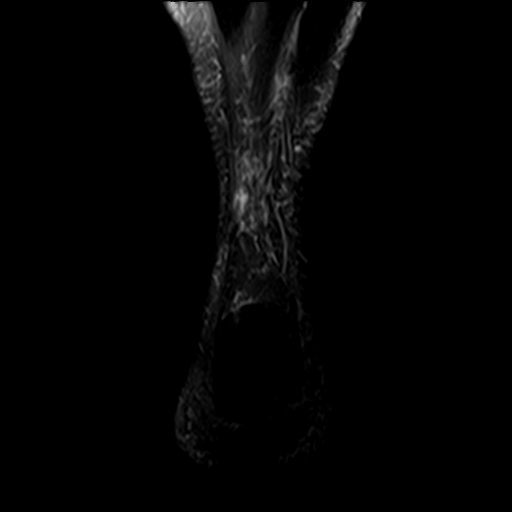
[im 42/42]
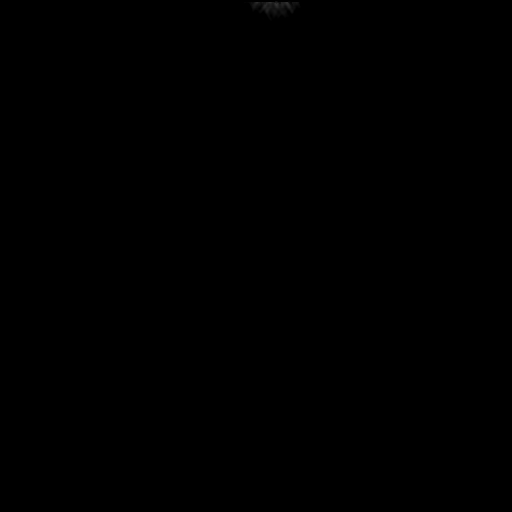

[29 of 40 positions shown; findings below may reference images not displayed]

FINDINGS: TENDONS

Peroneal: Fluid outlines the peroneal tendons. Intrasubstance split
tear of the peroneal brevis, series [DATE] and series [DATE] with
tendinosis of the peroneal longus.

Posteromedial: Intact tibialis posterior, flexor hallucis longus and
flexor digitorum longus tendons.

Anterior: Intact tibialis anterior, extensor hallucis longus and
extensor digitorum longus tendons.

Achilles: Intact.

Plantar Fascia: Intact

LIGAMENTS

Lateral: Intact tibiofibular and talofibular ligaments

Medial: Intact

CARTILAGE

Ankle Joint: Trace retrocalcaneal bursal fluid consistent bursitis.
Trace physiologic fluid in the ankle ankle joint.

Subtalar Joints/Sinus Tarsi: Trace physiologic fluid in the subtalar
joint.

Bones: No acute fracture or suspicious osseous lesions. Mild
reactive edema the third, fourth and fifth included metatarsals.

Other: Mild soft tissue edema of Kager's fat pad. Subcutaneous edema
along the posterolateral aspect of the ankle and included midfoot.
IMPRESSION: 1. Tenosynovitis suspected about the peroneal tendons with overlying
soft tissue subcutaneous edema and induration of Kager's fat pad.
Intrasubstance split tear of the peroneal brevis with tendinosis of
the peroneal longus tendons.
2. Minimal retrocalcaneal bursitis.
3. No acute osseous abnormality is noted. There is however faint
marrow edema of the third through fifth metatarsals which may be
reactive.

## 2018-12-16 ENCOUNTER — Ambulatory Visit: Payer: 59 | Admitting: Orthopedic Surgery

## 2018-12-16 ENCOUNTER — Encounter: Payer: Self-pay | Admitting: Orthopedic Surgery

## 2018-12-16 VITALS — Ht 65.0 in | Wt 196.0 lb

## 2018-12-16 DIAGNOSIS — S86312D Strain of muscle(s) and tendon(s) of peroneal muscle group at lower leg level, left leg, subsequent encounter: Secondary | ICD-10-CM

## 2018-12-16 NOTE — Progress Notes (Signed)
   Office Visit Note   Patient: Anna Wade           Date of Birth: February 19, 1965           MRN: [id number] Visit Date: 12/16/2018              Requested by: No referring provider defined for this encounter. PCP: Darcus Austin, MD (Inactive)  Chief Complaint  Patient presents with  . Left Foot - Follow-up      HPI:  Pleasant woman who is no 3 months s/p Left peroneus longus to brevis transfer. She feels much better than prior to surgery. She has completed PT. Her only complaint is persistent swelling in her ankle focused around the lateral ankle Assessment & Plan: Visit Diagnoses: No diagnosis found.  Plan: She will obtain compression hose and she was measured for these today. She will follow up as needed or if the swelling doesn't improve in 3 months  Follow-Up Instructions: No follow-ups on file.   Ortho Exam  Patient is alert, oriented, no adenopathy, well-dressed, normal affect, normal respiratory effort.   Left Ankle:Surgical incision healed. CMS intact. Compartments soft and non tender. Mild soft tissue swelling focused over lateral ankle. No cellulitis. Full ROM. Mild weakness with resisted eversion compared to the right ankle  Imaging: No results found. No images are attached to the encounter.  Labs: No results found for: HGBA1C, ESRSEDRATE, CRP, LABURIC, REPTSTATUS, GRAMSTAIN, CULT, LABORGA   No results found for: ALBUMIN, PREALBUMIN, LABURIC  No results found for: MG No results found for: VD25OH  No results found for: PREALBUMIN No flowsheet data found.   Body mass index is 32.62 kg/m.  Orders:  No orders of the defined types were placed in this encounter.  No orders of the defined types were placed in this encounter.    Procedures: No procedures performed  Clinical Data: No additional findings.  ROS:  All other systems negative, except as noted in the HPI. Review of Systems  Objective: Vital Signs: Ht 5\' 5"  (1.651 m)   Wt 196 lb  (88.9 kg)   BMI 32.62 kg/m   Specialty Comments:  No specialty comments available.  PMFS History: Patient Active Problem List   Diagnosis Date Noted  . Peroneal tendon rupture   . Chronic pain of left ankle 12/07/2017   Past Medical History:  Diagnosis Date  . PONV (postoperative nausea and vomiting)     No family history on file.  Past Surgical History:  Procedure Laterality Date  . CESAREAN SECTION     x2  . ENDOMETRIAL ABLATION  2006  . TENDON REPAIR Left 09/17/2018   Procedure: Reinforce Peroneal Brevis with Peroneal Longus Left;  Surgeon: Newt Minion, MD;  Location: [place];  Service: Orthopedics;  Laterality: Left;   Social History   Occupational History  . Not on file  Tobacco Use  . Smoking status: Former Smoker    Quit date: 04/29/2004    Years since quitting: 14.6  . Smokeless tobacco: Never Used  Substance and Sexual Activity  . Alcohol use: Not on file    Comment: 1x a month beer  . Drug use: No  . Sexual activity: Not on file

## 2019-07-28 DIAGNOSIS — H04122 Dry eye syndrome of left lacrimal gland: Secondary | ICD-10-CM | POA: Diagnosis not present

## 2019-07-28 DIAGNOSIS — H04121 Dry eye syndrome of right lacrimal gland: Secondary | ICD-10-CM | POA: Diagnosis not present

## 2019-08-11 DIAGNOSIS — H04121 Dry eye syndrome of right lacrimal gland: Secondary | ICD-10-CM | POA: Diagnosis not present

## 2019-08-12 DIAGNOSIS — T1511XA Foreign body in conjunctival sac, right eye, initial encounter: Secondary | ICD-10-CM | POA: Diagnosis not present

## 2019-09-08 DIAGNOSIS — Z7189 Other specified counseling: Secondary | ICD-10-CM | POA: Diagnosis not present

## 2019-09-08 DIAGNOSIS — F909 Attention-deficit hyperactivity disorder, unspecified type: Secondary | ICD-10-CM | POA: Diagnosis not present

## 2019-09-08 DIAGNOSIS — M9902 Segmental and somatic dysfunction of thoracic region: Secondary | ICD-10-CM | POA: Diagnosis not present

## 2019-09-08 DIAGNOSIS — M5386 Other specified dorsopathies, lumbar region: Secondary | ICD-10-CM | POA: Diagnosis not present

## 2019-09-08 DIAGNOSIS — M9901 Segmental and somatic dysfunction of cervical region: Secondary | ICD-10-CM | POA: Diagnosis not present

## 2019-09-08 DIAGNOSIS — M531 Cervicobrachial syndrome: Secondary | ICD-10-CM | POA: Diagnosis not present

## 2019-09-22 DIAGNOSIS — H04123 Dry eye syndrome of bilateral lacrimal glands: Secondary | ICD-10-CM | POA: Diagnosis not present

## 2019-10-08 DIAGNOSIS — M542 Cervicalgia: Secondary | ICD-10-CM | POA: Diagnosis not present

## 2019-11-05 DIAGNOSIS — M5386 Other specified dorsopathies, lumbar region: Secondary | ICD-10-CM | POA: Diagnosis not present

## 2019-11-05 DIAGNOSIS — M9902 Segmental and somatic dysfunction of thoracic region: Secondary | ICD-10-CM | POA: Diagnosis not present

## 2019-11-05 DIAGNOSIS — M531 Cervicobrachial syndrome: Secondary | ICD-10-CM | POA: Diagnosis not present

## 2019-11-05 DIAGNOSIS — M9901 Segmental and somatic dysfunction of cervical region: Secondary | ICD-10-CM | POA: Diagnosis not present

## 2019-12-18 DIAGNOSIS — F909 Attention-deficit hyperactivity disorder, unspecified type: Secondary | ICD-10-CM | POA: Diagnosis not present

## 2020-03-09 DIAGNOSIS — M5386 Other specified dorsopathies, lumbar region: Secondary | ICD-10-CM | POA: Diagnosis not present

## 2020-03-09 DIAGNOSIS — M9902 Segmental and somatic dysfunction of thoracic region: Secondary | ICD-10-CM | POA: Diagnosis not present

## 2020-03-09 DIAGNOSIS — M9901 Segmental and somatic dysfunction of cervical region: Secondary | ICD-10-CM | POA: Diagnosis not present

## 2020-03-09 DIAGNOSIS — M531 Cervicobrachial syndrome: Secondary | ICD-10-CM | POA: Diagnosis not present

## 2020-03-10 DIAGNOSIS — M5386 Other specified dorsopathies, lumbar region: Secondary | ICD-10-CM | POA: Diagnosis not present

## 2020-03-10 DIAGNOSIS — M9902 Segmental and somatic dysfunction of thoracic region: Secondary | ICD-10-CM | POA: Diagnosis not present

## 2020-03-10 DIAGNOSIS — M9901 Segmental and somatic dysfunction of cervical region: Secondary | ICD-10-CM | POA: Diagnosis not present

## 2020-03-10 DIAGNOSIS — M531 Cervicobrachial syndrome: Secondary | ICD-10-CM | POA: Diagnosis not present

## 2020-03-11 DIAGNOSIS — E7800 Pure hypercholesterolemia, unspecified: Secondary | ICD-10-CM | POA: Diagnosis not present

## 2020-03-11 DIAGNOSIS — Z0000 Encounter for general adult medical examination without abnormal findings: Secondary | ICD-10-CM | POA: Diagnosis not present

## 2020-03-11 DIAGNOSIS — E559 Vitamin D deficiency, unspecified: Secondary | ICD-10-CM | POA: Diagnosis not present

## 2020-03-18 DIAGNOSIS — Z01419 Encounter for gynecological examination (general) (routine) without abnormal findings: Secondary | ICD-10-CM | POA: Diagnosis not present

## 2020-03-18 DIAGNOSIS — Z6833 Body mass index (BMI) 33.0-33.9, adult: Secondary | ICD-10-CM | POA: Diagnosis not present

## 2020-03-18 DIAGNOSIS — Z1231 Encounter for screening mammogram for malignant neoplasm of breast: Secondary | ICD-10-CM | POA: Diagnosis not present

## 2020-06-13 DIAGNOSIS — U071 COVID-19: Secondary | ICD-10-CM | POA: Diagnosis not present

## 2020-07-11 DIAGNOSIS — H10023 Other mucopurulent conjunctivitis, bilateral: Secondary | ICD-10-CM | POA: Diagnosis not present

## 2020-07-30 DIAGNOSIS — H10413 Chronic giant papillary conjunctivitis, bilateral: Secondary | ICD-10-CM | POA: Diagnosis not present

## 2020-08-30 DIAGNOSIS — H04123 Dry eye syndrome of bilateral lacrimal glands: Secondary | ICD-10-CM | POA: Diagnosis not present

## 2020-09-17 DIAGNOSIS — H04123 Dry eye syndrome of bilateral lacrimal glands: Secondary | ICD-10-CM | POA: Diagnosis not present

## 2020-11-10 DIAGNOSIS — M9903 Segmental and somatic dysfunction of lumbar region: Secondary | ICD-10-CM | POA: Diagnosis not present

## 2020-11-10 DIAGNOSIS — M5386 Other specified dorsopathies, lumbar region: Secondary | ICD-10-CM | POA: Diagnosis not present

## 2020-11-10 DIAGNOSIS — M5442 Lumbago with sciatica, left side: Secondary | ICD-10-CM | POA: Diagnosis not present

## 2020-11-10 DIAGNOSIS — M9904 Segmental and somatic dysfunction of sacral region: Secondary | ICD-10-CM | POA: Diagnosis not present

## 2020-11-15 DIAGNOSIS — M9903 Segmental and somatic dysfunction of lumbar region: Secondary | ICD-10-CM | POA: Diagnosis not present

## 2020-11-15 DIAGNOSIS — M5442 Lumbago with sciatica, left side: Secondary | ICD-10-CM | POA: Diagnosis not present

## 2020-11-15 DIAGNOSIS — M9904 Segmental and somatic dysfunction of sacral region: Secondary | ICD-10-CM | POA: Diagnosis not present

## 2020-11-15 DIAGNOSIS — M5386 Other specified dorsopathies, lumbar region: Secondary | ICD-10-CM | POA: Diagnosis not present

## 2020-11-30 DIAGNOSIS — M5442 Lumbago with sciatica, left side: Secondary | ICD-10-CM | POA: Diagnosis not present

## 2020-11-30 DIAGNOSIS — M5386 Other specified dorsopathies, lumbar region: Secondary | ICD-10-CM | POA: Diagnosis not present

## 2020-11-30 DIAGNOSIS — M9904 Segmental and somatic dysfunction of sacral region: Secondary | ICD-10-CM | POA: Diagnosis not present

## 2020-11-30 DIAGNOSIS — M9903 Segmental and somatic dysfunction of lumbar region: Secondary | ICD-10-CM | POA: Diagnosis not present

## 2020-12-15 DIAGNOSIS — G4483 Primary cough headache: Secondary | ICD-10-CM | POA: Diagnosis not present

## 2020-12-15 DIAGNOSIS — J029 Acute pharyngitis, unspecified: Secondary | ICD-10-CM | POA: Diagnosis not present

## 2020-12-15 DIAGNOSIS — H6691 Otitis media, unspecified, right ear: Secondary | ICD-10-CM | POA: Diagnosis not present

## 2020-12-28 DIAGNOSIS — M9904 Segmental and somatic dysfunction of sacral region: Secondary | ICD-10-CM | POA: Diagnosis not present

## 2020-12-28 DIAGNOSIS — M9903 Segmental and somatic dysfunction of lumbar region: Secondary | ICD-10-CM | POA: Diagnosis not present

## 2020-12-28 DIAGNOSIS — M5386 Other specified dorsopathies, lumbar region: Secondary | ICD-10-CM | POA: Diagnosis not present

## 2020-12-28 DIAGNOSIS — M5442 Lumbago with sciatica, left side: Secondary | ICD-10-CM | POA: Diagnosis not present

## 2021-08-25 ENCOUNTER — Ambulatory Visit (INDEPENDENT_AMBULATORY_CARE_PROVIDER_SITE_OTHER): Payer: BC Managed Care – PPO | Admitting: Podiatry

## 2021-08-25 DIAGNOSIS — B351 Tinea unguium: Secondary | ICD-10-CM

## 2021-08-25 DIAGNOSIS — L600 Ingrowing nail: Secondary | ICD-10-CM

## 2021-08-25 NOTE — Patient Instructions (Signed)

## 2021-08-25 NOTE — Progress Notes (Signed)
Subjective:   Patient ID: Anna Wade, female   DOB: 56 y.o.   MRN: [id number]   HPI  Chief Complaint  Patient presents with   Ingrown Toenail    Started 3 mths ago when to urgent care and had right hallux ingrown removed, and pt had been having pain ever since, rate of pain 8 out of 10, no drainage or swelling, pt also as bilateral nail fungus    56 year old female presents with above complaints.  She states that 3 months ago she went to urgent care she had an ingrown toenail removed and ever since then she has had discomfort.  No drainage or pus at this time.  Also some nail fungus on both of her toes.  Review of Systems  All other systems reviewed and are negative.  Past Medical History:  Diagnosis Date   PONV (postoperative nausea and vomiting)     Past Surgical History:  Procedure Laterality Date   CESAREAN SECTION     x2   ENDOMETRIAL ABLATION  2006   TENDON REPAIR Left 09/17/2018   Procedure: Reinforce Peroneal Brevis with Peroneal Longus Left;  Surgeon: Nadara Mustard, MD;  Location: [facility];  Service: Orthopedics;  Laterality: Left;     Current Outpatient Medications:    Multiple Vitamin (MULTIVITAMIN) tablet, Take 1 tablet by mouth daily., Disp: , Rfl:    Omega-3 Fatty Acids (FISH OIL) 1000 MG CAPS, Take by mouth 2 (two) times daily., Disp: , Rfl:   No Known Allergies         Objective:  Physical Exam  General: AAO x3, NAD  Dermatological: Right hallux toenail is hypertrophic, dystrophic with yellow discoloration of the conventional nail borders.  Bilateral hallux nails also dystrophic and discolored.  No hyperpigmentation.  No open lesions.  There is no drainage or pus.  Vascular: Dorsalis Pedis artery and Posterior Tibial artery pedal pulses are 2/4 bilateral with immedate capillary fill time.  There is no pain with calf compression, swelling, warmth, erythema.   Neruologic: Grossly intact via light touch bilateral.    Musculoskeletal: Toenail.  Muscular strength 5/5 in all groups tested bilateral.  Gait: Unassisted, Nonantalgic.       Assessment:   Right hallux toenail ingrown toenail, onychomycosis      Plan:  -Treatment options discussed including all alternatives, risks, and complications -Etiology of symptoms were discussed -At this time, recommended total nail removal without chemical matricectomy to the right hallux due to the pain and thickening of the nail. Risks and complications were discussed with the patient for which they understand and  verbally consent to the procedure. Under sterile conditions a total of 3 mL of a mixture of 2% lidocaine plain and 0.5% Marcaine plain was infiltrated in a hallux block fashion. Once anesthetized, the skin was prepped in sterile fashion. A tourniquet was then applied.  The right hallux nail was removed in toto making sure to remove all nail borders.  Once the nail was removed, the area was debrided and the underlying skin was intact. The area was irrigated and hemostasis was obtained.  A dry sterile dressing was applied. After application of the dressing the tourniquet was removed and there is found to be an immediate capillary refill time to the digit. The patient tolerated the procedure well any complications. Post procedure instructions were discussed the patient for which he verbally understood. Discussed signs/symptoms of worsening infection and directed to call the office immediately should any occur or go directly  to the emergency room. In the meantime, encouraged to call the office with any questions, concerns, changes symptoms. -Toenail sent to Isaiah Blakes DPM

## 2021-09-09 ENCOUNTER — Ambulatory Visit (INDEPENDENT_AMBULATORY_CARE_PROVIDER_SITE_OTHER): Payer: BC Managed Care – PPO | Admitting: Podiatry

## 2021-09-09 DIAGNOSIS — L600 Ingrowing nail: Secondary | ICD-10-CM

## 2021-09-09 DIAGNOSIS — L603 Nail dystrophy: Secondary | ICD-10-CM | POA: Diagnosis not present

## 2021-09-09 NOTE — Patient Instructions (Signed)
Start a biotin supplement or a vitamin for hair, skin, and nails  If the nail starts to come in thicker you can start UREA NAIL GEL 47%

## 2021-09-11 NOTE — Progress Notes (Signed)
Subjective: 56 year old female presents the office today for follow-up evaluation after undergoing right total nail avulsion.  She states that she is not having any pain, drainage or pus since she stopped soaking it.  No significant pain.  She has no other concerns today.  Objective: AAO x3, NAD DP/PT pulses palpable bilaterally, CRT less than 3 seconds Status post right hallux nail avulsion.  Scab is still present but there is no drainage or pus and there is no edema, erythema or any signs of infection. No pain with calf compression, swelling, warmth, erythema  Assessment: Onychodystrophy  Plan: -All treatment options discussed with the patient including all alternatives, risks, complications.  -Procedure site appears to be healing well.  Recommended washing with soap and water and I would still apply antibiotic ointment and a Band-Aid during the day for protection but leave the area open at nighttime. -Reviewed the culture results with her.  We will need to watch the nails and start Cipro back in.  Recommend going to start a biotin supplement if it starts to come up thicker or discolored urea nail gel. -Patient encouraged to call the office with any questions, concerns, change in symptoms.   Vivi Barrack DPM

## 2021-10-25 ENCOUNTER — Other Ambulatory Visit: Payer: Self-pay | Admitting: Family Medicine

## 2021-10-25 DIAGNOSIS — R471 Dysarthria and anarthria: Secondary | ICD-10-CM

## 2021-10-26 ENCOUNTER — Ambulatory Visit
Admission: RE | Admit: 2021-10-26 | Discharge: 2021-10-26 | Disposition: A | Discharge status: Home / Self Care | Payer: BC Managed Care – PPO | Source: Ambulatory Visit | Attending: Family Medicine | Admitting: Family Medicine

## 2021-10-26 DIAGNOSIS — R471 Dysarthria and anarthria: Secondary | ICD-10-CM

## 2021-10-26 MED ORDER — GADOBENATE DIMEGLUMINE 529 MG/ML IV SOLN
20.0000 mL | Freq: Once | INTRAVENOUS | Status: AC | PRN
  Administered 2021-10-26: 20 mL via INTRAVENOUS

## 2022-01-19 ENCOUNTER — Ambulatory Visit (INDEPENDENT_AMBULATORY_CARE_PROVIDER_SITE_OTHER): Payer: BC Managed Care – PPO | Admitting: Neurology

## 2022-01-19 ENCOUNTER — Encounter: Payer: Self-pay | Admitting: Neurology

## 2022-01-19 VITALS — BP 124/86 | HR 88 | Ht 65.0 in | Wt 203.5 lb

## 2022-01-19 DIAGNOSIS — G43709 Chronic migraine without aura, not intractable, without status migrainosus: Secondary | ICD-10-CM | POA: Diagnosis not present

## 2022-01-19 DIAGNOSIS — R410 Disorientation, unspecified: Secondary | ICD-10-CM | POA: Diagnosis not present

## 2022-01-19 NOTE — Progress Notes (Signed)
Chief Complaint  Patient presents with   New Patient (Initial Visit)    Rm 14. Alone. NP/[name] MD/dysarthria.      ASSESSMENT AND PLAN  Anna Wade is a 56 y.o. female  Intermittent confusion episodes,  Discussed with patient, differentiation diagnosis included complicated migraine, remote possibility of partial seizure, or TIA,  Personally reviewed MRI of the brain with without contrast October 26, 2021, essentially normal  We decided to continue observe her symptoms, she will call for recurrence   DIAGNOSTIC DATA (LABS, IMAGING, TESTING) - I reviewed patient records, labs, notes, testing and imaging myself where available.   MEDICAL HISTORY:  Anna Wade, is a 56 year old female seen in request by her primary care from [facility] Dr. Garth Bigness for evaluation of transient confusion, initial evaluation January 19, 2022   I reviewed and summarized the referring note. PMHX.  She reported history of migraine headache, only happening intermittently, sometimes proceeding by visual aura, zigzag lines in her visual field  On October 19, 2021, she was having a video call with her husband,  during the middle of the conversation, her husband noticed that she had slurred speech," are you drunk", she felt lightheaded dizzy, when she checked her blood pressure then, noted diastolic blood pressure was more than 100, word finding difficulty mild confusion and slurred speech last about 10 minutes, she came back to normal, deny headache then  Another episode few years back, she was texting her coworker, suddenly get confused, the contents did not make sense, lasting for few minutes, could not remember if she has headache then  Recent laboratory evaluation September 2023 showed normal CMP hemoglobin TSH,  PHYSICAL EXAM:   Vitals:   01/19/22 1345  BP: 124/86  Pulse: 88  Weight: 203 lb 8 oz (92.3 kg)  Height: 5\' 5"  (1.651 m)   Not  recorded     Body mass index is 33.86 kg/m.  PHYSICAL EXAMNIATION:  Gen: NAD, conversant, well nourised, well groomed                     Cardiovascular: Regular rate rhythm, no peripheral edema, warm, nontender. Eyes: Conjunctivae clear without exudates or hemorrhage Neck: Supple, no carotid bruits. Pulmonary: Clear to auscultation bilaterally   NEUROLOGICAL EXAM:  MENTAL STATUS: Speech/cognition: Awake, alert, oriented to history taking and casual conversation CRANIAL NERVES: CN II: Visual fields are full to confrontation. Pupils are round equal and briskly reactive to light. CN III, IV, VI: extraocular movement are normal. No ptosis. CN V: Facial sensation is intact to light touch CN VII: Face is symmetric with normal eye closure  CN VIII: Hearing is normal to causal conversation. CN IX, X: Phonation is normal. CN XI: Head turning and shoulder shrug are intact  MOTOR: There is no pronator drift of out-stretched arms. Muscle bulk and tone are normal. Muscle strength is normal.  REFLEXES: Reflexes are 2+ and symmetric at the biceps, triceps, knees, and ankles. Plantar responses are flexor.  SENSORY: Intact to light touch, pinprick and vibratory sensation are intact in fingers and toes.  COORDINATION: There is no trunk or limb dysmetria noted.  GAIT/STANCE: Posture is normal. Gait is steady with normal steps, base, arm swing, and turning. Heel and toe walking are normal. Tandem gait is normal.  Romberg is absent.  REVIEW OF SYSTEMS:  Full 14 system review of systems performed and notable only for as above All other review of systems were negative.   ALLERGIES:  No Known Allergies  HOME MEDICATIONS: Current Outpatient Medications  Medication Sig Dispense Refill   Multiple Vitamin (MULTIVITAMIN) tablet Take 1 tablet by mouth daily.     Omega-3 Fatty Acids (FISH OIL) 1000 MG CAPS Take by mouth 2 (two) times daily.     No current facility-administered medications  for this visit.    PAST MEDICAL HISTORY: Past Medical History:  Diagnosis Date   PONV (postoperative nausea and vomiting)     PAST SURGICAL HISTORY: Past Surgical History:  Procedure Laterality Date   CESAREAN SECTION     x2   ENDOMETRIAL ABLATION  2006   TENDON REPAIR Left 09/17/2018   Procedure: Reinforce Peroneal Brevis with Peroneal Longus Left;  Surgeon: Nadara Mustard, MD;  Location: [facility];  Service: Orthopedics;  Laterality: Left;    FAMILY HISTORY: History reviewed. No pertinent family history.  SOCIAL HISTORY: Social History   Socioeconomic History   Marital status: Single    Spouse name: Not on file   Number of children: Not on file   Years of education: Not on file   Highest education level: Not on file  Occupational History   Not on file  Tobacco Use   Smoking status: Former    Types: Cigarettes    Quit date: 04/29/2004    Years since quitting: 17.7   Smokeless tobacco: Never  Vaping Use   Vaping Use: Never used  Substance and Sexual Activity   Alcohol use: Not on file    Comment: 1x a month beer   Drug use: No   Sexual activity: Not on file  Other Topics Concern   Not on file  Social History Narrative   Not on file   Social Determinants of Health   Financial Resource Strain: Not on file  Food Insecurity: Not on file  Transportation Needs: Not on file  Physical Activity: Not on file  Stress: Not on file  Social Connections: Not on file  Intimate Partner Violence: Not on file      Levert Feinstein, M.D. Ph.D.  [facility] [address] [place], [place] [address] Ph: [phone] Fax: [phone]  CC:  Shon Hale, MD [address] [place],  [place] [address]  Shon Hale, MD
